# Patient Record
Sex: Male | Born: 1935
Health system: Southern US, Community
[De-identification: ages and names within clinical notes are randomized; demographics above are authoritative.]

## PROBLEM LIST (undated history)

## (undated) DIAGNOSIS — I10 Essential (primary) hypertension: Secondary | ICD-10-CM

## (undated) DIAGNOSIS — C801 Malignant (primary) neoplasm, unspecified: Secondary | ICD-10-CM

## (undated) DIAGNOSIS — C20 Malignant neoplasm of rectum: Secondary | ICD-10-CM

## (undated) DIAGNOSIS — E785 Hyperlipidemia, unspecified: Secondary | ICD-10-CM

## (undated) DIAGNOSIS — I2510 Atherosclerotic heart disease of native coronary artery without angina pectoris: Secondary | ICD-10-CM

## (undated) HISTORY — PX: RECTAL SURGERY: SHX760

## (undated) HISTORY — DX: Malignant neoplasm of rectum: C20

## (undated) HISTORY — DX: Atherosclerotic heart disease of native coronary artery without angina pectoris: I25.10

## (undated) HISTORY — PX: CORONARY ARTERY BYPASS GRAFT: SHX141

## (undated) HISTORY — DX: Essential (primary) hypertension: I10

## (undated) HISTORY — PX: COLOSTOMY: SHX63

## (undated) HISTORY — PX: COLON SURGERY: SHX602

## (undated) HISTORY — DX: Hyperlipidemia, unspecified: E78.5

---

## 1997-04-21 HISTORY — PX: BYPASS GRAFT: SHX909

## 1997-08-30 ENCOUNTER — Ambulatory Visit (HOSPITAL_COMMUNITY)
Admission: RE | Admit: 1997-08-30 | Discharge: 1997-08-30 | Payer: Self-pay | Admitting: Thoracic Surgery (Cardiothoracic Vascular Surgery)

## 1997-08-31 ENCOUNTER — Inpatient Hospital Stay (HOSPITAL_COMMUNITY): Admission: RE | Admit: 1997-08-31 | Discharge: 1997-09-04 | Payer: Self-pay | Admitting: Cardiology

## 2000-11-09 ENCOUNTER — Ambulatory Visit (HOSPITAL_COMMUNITY): Admission: RE | Admit: 2000-11-09 | Discharge: 2000-11-09 | Payer: Self-pay | Admitting: Internal Medicine

## 2000-11-16 ENCOUNTER — Encounter (INDEPENDENT_AMBULATORY_CARE_PROVIDER_SITE_OTHER): Payer: Self-pay | Admitting: *Deleted

## 2000-11-16 LAB — CONVERTED CEMR LAB
Bilirubin, Direct: 0.1 mg/dL
Chloride: 24 meq/L
Cholesterol: 164 mg/dL
LDL Cholesterol: 97 mg/dL
Potassium: 102 meq/L
Sodium: 4.3 meq/L
Total Protein: 7.5 g/dL

## 2000-12-23 ENCOUNTER — Encounter: Payer: Self-pay | Admitting: Cardiology

## 2000-12-23 ENCOUNTER — Ambulatory Visit (HOSPITAL_COMMUNITY): Admission: RE | Admit: 2000-12-23 | Discharge: 2000-12-23 | Payer: Self-pay | Admitting: Cardiology

## 2001-05-25 ENCOUNTER — Inpatient Hospital Stay (HOSPITAL_COMMUNITY): Admission: AD | Admit: 2001-05-25 | Discharge: 2001-05-26 | Payer: Self-pay | Admitting: General Surgery

## 2001-05-25 ENCOUNTER — Encounter: Payer: Self-pay | Admitting: General Surgery

## 2001-06-28 ENCOUNTER — Ambulatory Visit (HOSPITAL_COMMUNITY): Admission: RE | Admit: 2001-06-28 | Discharge: 2001-06-28 | Payer: Self-pay | Admitting: Pulmonary Disease

## 2001-07-05 ENCOUNTER — Ambulatory Visit (HOSPITAL_COMMUNITY): Admission: RE | Admit: 2001-07-05 | Discharge: 2001-07-05 | Payer: Self-pay | Admitting: Pulmonary Disease

## 2001-09-30 ENCOUNTER — Ambulatory Visit (HOSPITAL_COMMUNITY): Admission: RE | Admit: 2001-09-30 | Discharge: 2001-09-30 | Payer: Self-pay | Admitting: Pulmonary Disease

## 2002-01-11 ENCOUNTER — Ambulatory Visit (HOSPITAL_COMMUNITY): Admission: RE | Admit: 2002-01-11 | Discharge: 2002-01-11 | Payer: Self-pay | Admitting: Cardiology

## 2002-01-11 ENCOUNTER — Encounter: Payer: Self-pay | Admitting: Cardiology

## 2002-04-28 ENCOUNTER — Ambulatory Visit (HOSPITAL_COMMUNITY): Admission: RE | Admit: 2002-04-28 | Discharge: 2002-04-28 | Payer: Self-pay | Admitting: Pulmonary Disease

## 2003-01-20 ENCOUNTER — Ambulatory Visit (HOSPITAL_COMMUNITY): Admission: RE | Admit: 2003-01-20 | Discharge: 2003-01-20 | Payer: Self-pay | Admitting: Cardiology

## 2003-01-20 ENCOUNTER — Encounter: Payer: Self-pay | Admitting: Cardiology

## 2003-04-22 HISTORY — PX: CARDIOVASCULAR STRESS TEST: SHX262

## 2004-01-23 ENCOUNTER — Ambulatory Visit (HOSPITAL_COMMUNITY): Admission: RE | Admit: 2004-01-23 | Discharge: 2004-01-23 | Payer: Self-pay | Admitting: Internal Medicine

## 2004-06-04 ENCOUNTER — Inpatient Hospital Stay (HOSPITAL_COMMUNITY): Admission: RE | Admit: 2004-06-04 | Discharge: 2004-06-15 | Payer: Self-pay | Admitting: General Surgery

## 2004-12-19 ENCOUNTER — Ambulatory Visit: Payer: Self-pay | Admitting: *Deleted

## 2005-08-29 ENCOUNTER — Ambulatory Visit (HOSPITAL_COMMUNITY): Admission: RE | Admit: 2005-08-29 | Discharge: 2005-08-29 | Payer: Self-pay | Admitting: Pulmonary Disease

## 2006-07-27 ENCOUNTER — Ambulatory Visit (HOSPITAL_COMMUNITY): Admission: RE | Admit: 2006-07-27 | Discharge: 2006-07-27 | Payer: Self-pay | Admitting: Ophthalmology

## 2008-01-20 IMAGING — CT CT PELVIS W/ CM
2 of 6 series · 11 of 32 positions shown, 16 images · IV contrast (omnipaque)
Comparison: none

CLINICAL DATA: Confusion, colon cancer.
HEAD CT WITHOUT AND WITH CONTRAST ? 08/29/05:
TECHNIQUE: Contiguous axial CT images were obtained from the base of the skull through the vertex according to standard protocol before and after administration of intravenous contrast.
Contrast:  150 cc Omnipaque 300 IV.
TECHNIQUE: Multidetector CT imaging of the chest was performed following the standard protocol during bolus administration of intravenous contrast.
TECHNIQUE: Multidetector CT imaging of the abdomen was performed following the standard protocol during bolus administration of intravenous contrast.
TECHNIQUE: Multidetector CT imaging of the pelvis was performed following the standard protocol during bolus administration of intravenous contrast.

[Series 1598: — · axial · 0.70mm/px · z∈[-1040,-860]mm · 4 of 62 slices shown (1 of 2)]
[im 13/62  soft-tissue]
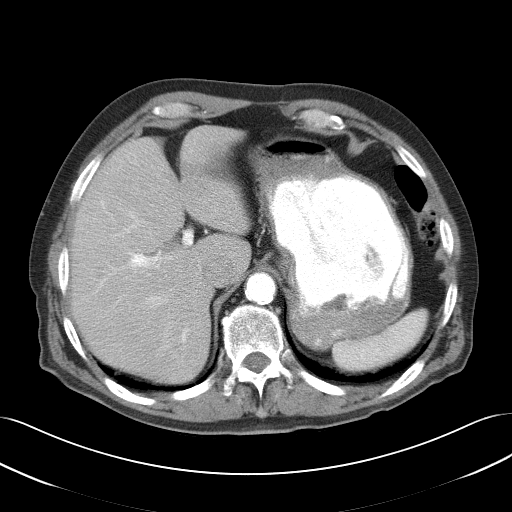
[im 25/62  soft-tissue]
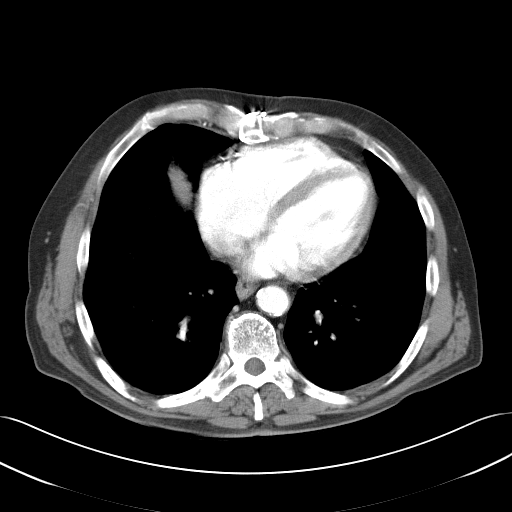
[im 37/62  soft-tissue]
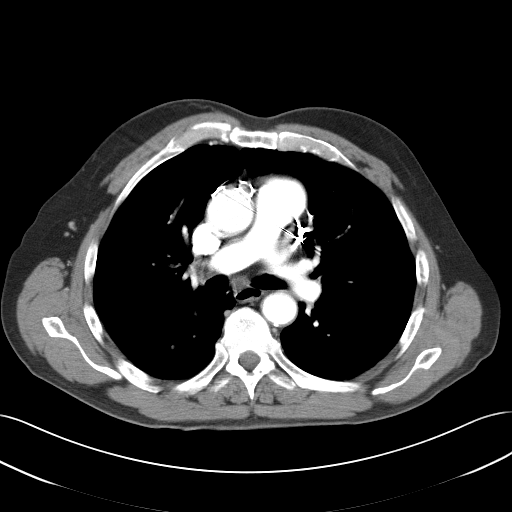
[im 49/62  soft-tissue]
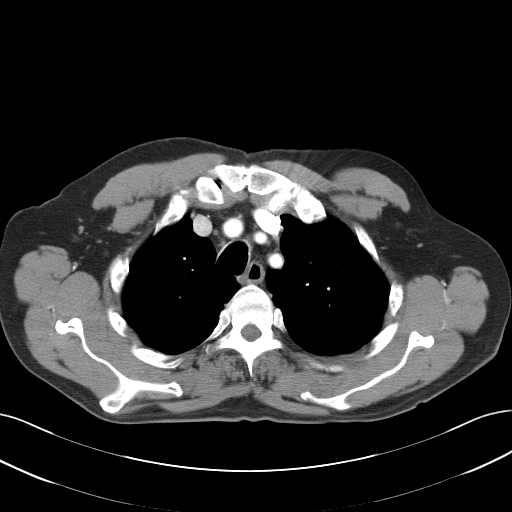

[Series 1599: — · axial · 0.71mm/px · z∈[-1387,-1047]mm · 7 of 92 slices shown, 12 images (2 of 2)]
[im 12/92  soft-tissue]
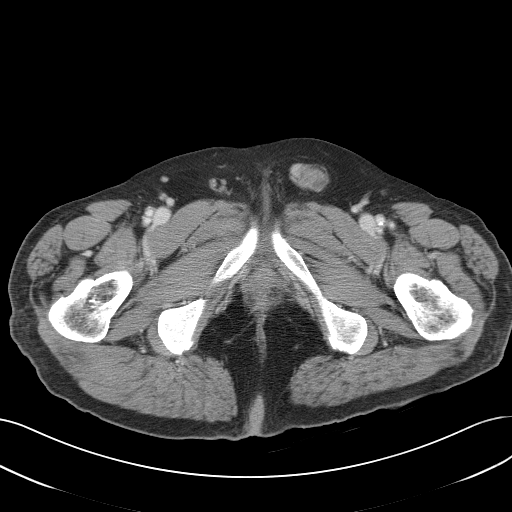
[im 12/92  bone]
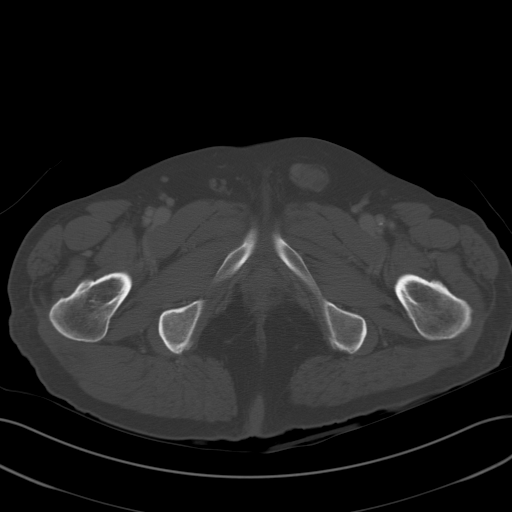
[im 23/92  soft-tissue]
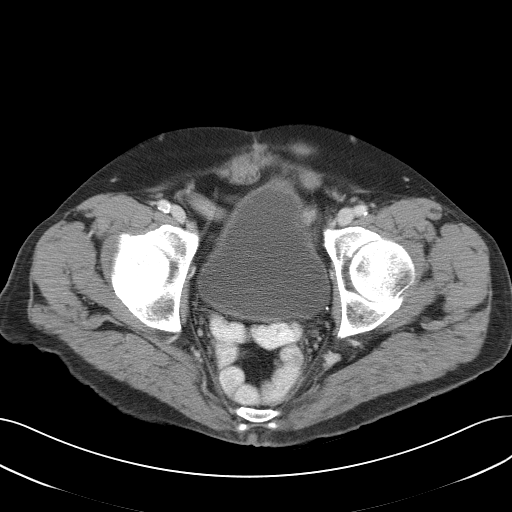
[im 35/92  soft-tissue]
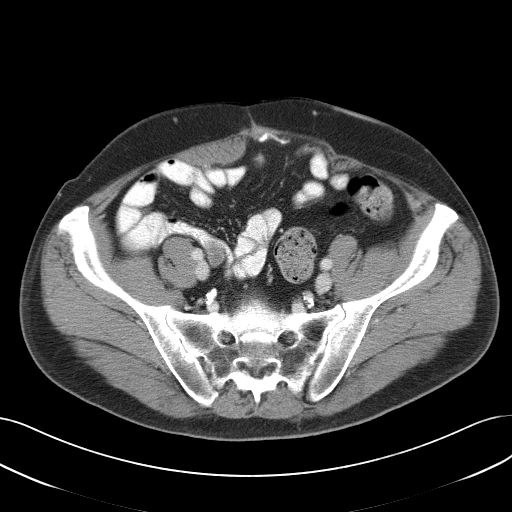
[im 46/92  soft-tissue]
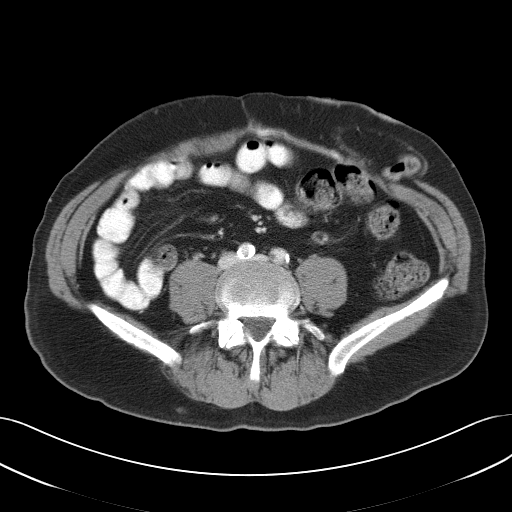
[im 46/92  lung]
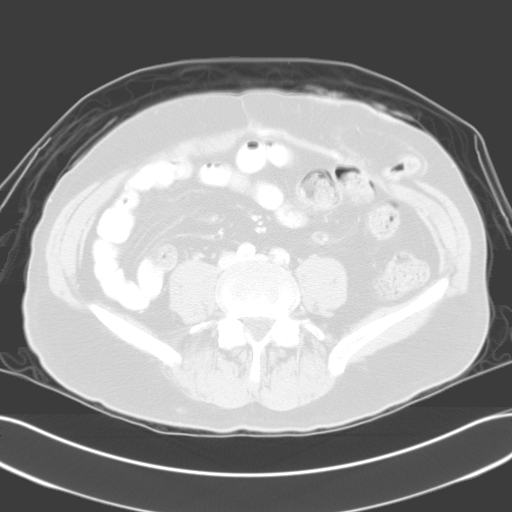
[im 57/92  soft-tissue]
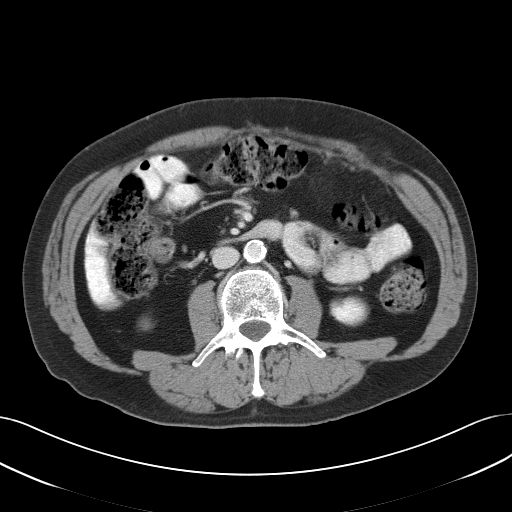
[im 57/92  lung]
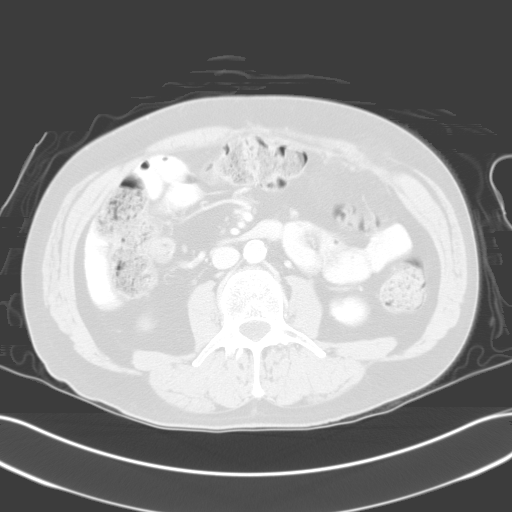
[im 69/92  soft-tissue]
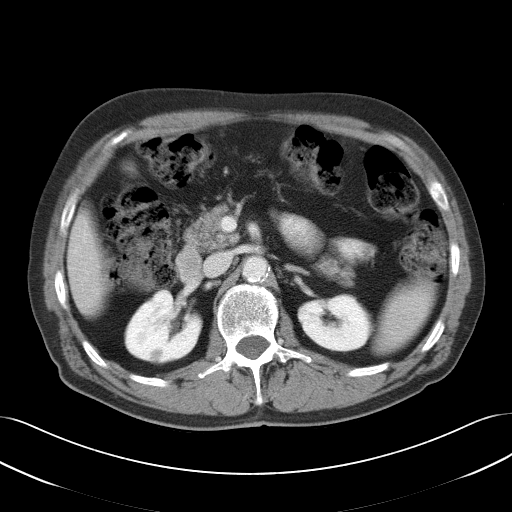
[im 69/92  lung]
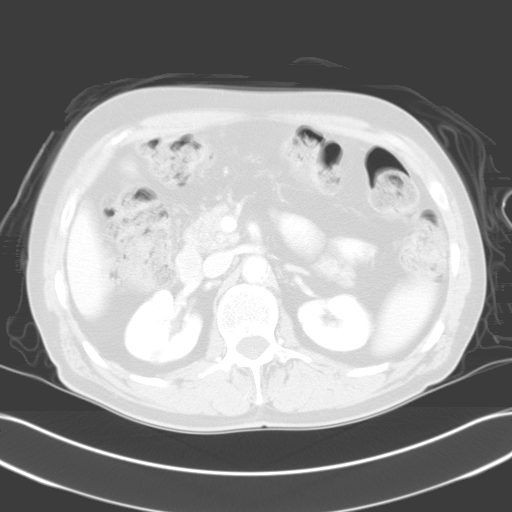
[im 80/92  soft-tissue]
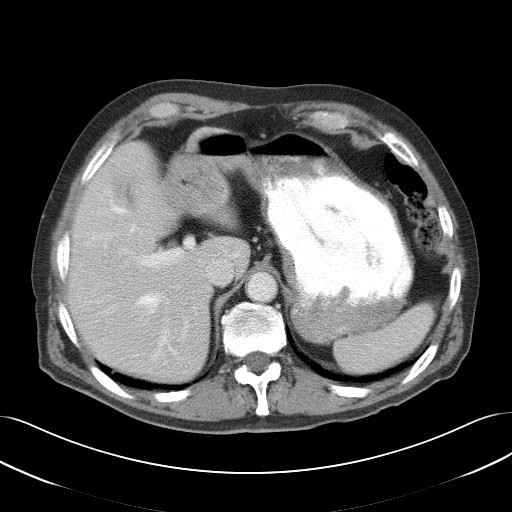
[im 80/92  lung]
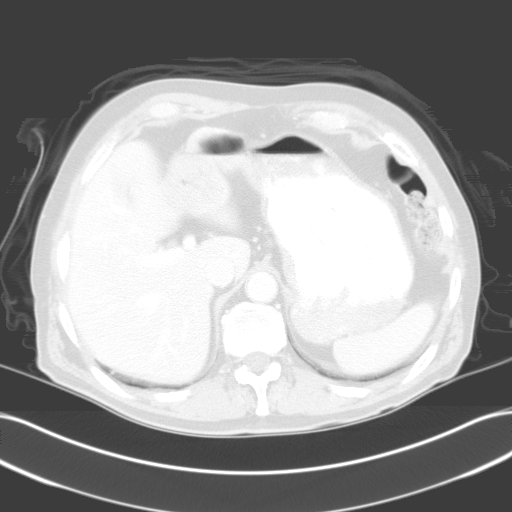

[11 of 32 positions shown; findings below may reference images not displayed]

FINDINGS: No acute intracranial hemorrhage is seen.  There is no intra- or extra-axial fluid collection.  There is a 5 mm hypodensity in the left region of the external capsule which may represent a Virchow-Robin space versus age-indeterminate lacunar infarct.  No midline shift or mass effect is seen.  The ventricles are normal in size.  No enhancing mass lesion is seen on postcontrast imaging.  No lytic or sclerotic skull lesion is present.  The paranasal sinuses and orbits are intact.
IMPRESSION: No acute intracranial finding or evidence for enhancing metastatic lesions.
5 mm left external capsular age-indeterminate lacunar infarct versus Virchow-Robin space.
CHEST CT WITH CONTRAST ? 08/29/05:
FINDINGS: Evidence of CABG is noted.  The heart size is borderline enlarged.  There is no mediastinal or perihilar lymphadenopathy.  No pleural or pericardial effusion.  Motion artifact obscures the lung parenchyma fine detail.  Bibasilar dependent atelectasis is noted.  No pulmonary mass is seen.  A 3 mm oval nodule abutting the left major fissure on image #28 is consistent with an intrapulmonary lymph node.
IMPRESSION: No acute intrathoracic process or evidence for disease metastasis.  
ABDOMEN CT WITH CONTRAST ? 08/29/05:
FINDINGS: 10 mm and 4 mm right lower renal pole cysts are noted.  The liver, gallbladder, spleen, pancreas, and adrenal glands are unremarkable.  No hydronephrosis is seen.  A few small mesenteric lymph nodes are identified, the largest of which measures 4 mm in short-axis dimensions.  There is no ascites or free air.
IMPRESSION: No acute intra-abdominal pathology or CT evidence for metastatic disease.
Right lower renal pole cysts.
PELVIS CT WITH CONTRAST ? 08/29/05:
FINDINGS: There is a left-sided fat-containing parastomal hernia and diastasis recti containing a knuckle of small bowel over the lower pelvis.  No small or large bowel wall thickening or dilatation is seen.  The patient is status post distal subtotal colectomy.  There is no pelvic lymphadenopathy or free fluid.  A fluid-containing left inguinal hernia is noted.  No lytic or sclerotic osseous lesion is seen.  Degenerative change is noted in the spine.
IMPRESSION: Left lower quadrant colostomy from subtotal distal colectomy with fat-containing parastomal hernia present.
Diastasis recti containing a knuckle of small bowel; no secondary evidence for small bowel obstruction.
Fluid-containing left inguinal hernia.
No CT evidence for intrapelvic metastases.

## 2008-09-25 ENCOUNTER — Ambulatory Visit (HOSPITAL_COMMUNITY): Admission: RE | Admit: 2008-09-25 | Discharge: 2008-09-25 | Payer: Self-pay | Admitting: Ophthalmology

## 2009-04-24 DIAGNOSIS — E785 Hyperlipidemia, unspecified: Secondary | ICD-10-CM

## 2009-04-24 DIAGNOSIS — I10 Essential (primary) hypertension: Secondary | ICD-10-CM | POA: Insufficient documentation

## 2009-04-25 ENCOUNTER — Ambulatory Visit: Payer: Self-pay | Admitting: Cardiology

## 2009-04-25 ENCOUNTER — Encounter (INDEPENDENT_AMBULATORY_CARE_PROVIDER_SITE_OTHER): Payer: Self-pay | Admitting: *Deleted

## 2009-04-25 DIAGNOSIS — I25810 Atherosclerosis of coronary artery bypass graft(s) without angina pectoris: Secondary | ICD-10-CM | POA: Insufficient documentation

## 2010-05-21 NOTE — Miscellaneous (Signed)
Summary: labs cmp,lipids,liver tsh 11/16/2000  Clinical Lists Changes  Observations: Added new observation of ALBUMIN: 4.4 g/dL (60/45/4098 1:19) Added new observation of PROTEIN, TOT: 7.5 g/dL (14/78/2956 2:13) Added new observation of SGPT (ALT): 19 units/L (11/16/2000 9:42) Added new observation of SGOT (AST): 19 units/L (11/16/2000 9:42) Added new observation of ALK PHOS: 60 units/L (11/16/2000 9:42) Added new observation of BILI DIRECT: 0.1 mg/dL (08/65/7846 9:62) Added new observation of CL SERUM: 24 meq/L (11/16/2000 9:42) Added new observation of K SERUM: 102 meq/L (11/16/2000 9:42) Added new observation of NA: 4.3 meq/L (11/16/2000 9:42) Added new observation of LDL: 97 mg/dL (95/28/4132 4:40) Added new observation of HDL: 45 mg/dL (02/15/2535 6:44) Added new observation of TRIGLYC TOT: 112 mg/dL (03/47/4259 5:63) Added new observation of CHOLESTEROL: 164 mg/dL (87/56/4332 9:51) Added new observation of TSH: 1.599 microintl units/mL (11/16/2000 9:42)

## 2010-05-21 NOTE — Assessment & Plan Note (Signed)
Summary: EC6 JUST TIME FOR A CHECK UP/TMJ   Visit Type:  Follow-up Primary Provider:  Dr.Hawkins   History of Present Illness: [name] comes in today after a three-year absence from the practice. He has a history of coronary bypass grafting 11 years ago which I cannot find an E. chart. Last objective assessment I can find was an echocardiogram in 2004 which showed normal left ventricular function. He also had a normal stress echo at that time.  He is totally asymptomatic. He mows grass, chills no last week of Christmas, and stays wide open and has no symptoms of angina or ischemia. He denies orthopnea PND or peripheral edema. He's had no palpitations, presyncope or syncope.  He is very compliant with his medications. Blood work is followed by Dr. Juanetta Gosling. His last lipids were goal except for low HDL.  Current Medications (verified): 1)  Norvasc 5 Mg Tabs (Amlodipine Besylate) .... Take 1 Tab Daily 2)  Micardis 40 Mg Tabs (Telmisartan) .... Take 1tab Daily 3)  Aspir-Low 81 Mg Tbec (Aspirin) .... Take 1 Tab Daily 4)  Folic Acid 1 Mg Tabs (Folic Acid) .... Take 1 Tab Daily 5)  Lipitor 10 Mg Tabs (Atorvastatin Calcium) .... Take 1 Tab Daily 6)  Daily Multi  Tabs (Multiple Vitamins-Minerals) .... Take 1 Tab Daily 7)  Fish Oil 1000 Mg Caps (Omega-3 Fatty Acids) .... Take 1 Cap Daily  Allergies (verified): No Known Drug Allergies  Past History:  Past Medical History: Last updated: 04/24/2009 Current Problems:  HYPERLIPIDEMIA (ICD-272.4) ATHEROSCLEROTIC HEART DISEASE (ICD-414.00) HYPERTENSION (ICD-401.9)  Past Surgical History: Last updated: 04/24/2009 rectal carcinoma coronary artery bypass graft  with 5 vessel bypass in 1999 stress test 2005  Social History: Last updated: 04/24/2009 Retired  Married  Tobacco Use - Yes.  Alcohol Use - no Regular Exercise - yes Drug Use - no  Risk Factors: Exercise: yes (04/24/2009)  Risk Factors: Smoking Status: current  (04/24/2009)  Review of Systems       negative other than history of present illness  Vital Signs:  Patient profile:   76 year old male Height:      68 inches Weight:      145 pounds BMI:     22.13 Pulse rate:   85 / minute BP sitting:   148 / 72  (right arm)  Vitals Entered By: Dreama Saa, CNA (April 25, 2009 10:58 AM)  Physical Exam  General:  Well developed, well nourished, in no acute distress. Head:  normocephalic and atraumatic Eyes:  PERRLA/EOM intact; conjunctiva and lids normal. Neck:  Neck supple, no JVD. No masses, thyromegaly or abnormal cervical nodes. Lungs:  Clear bilaterally to auscultation and percussion. Heart:  Non-displaced PMI, chest non-tender; regular rate and rhythm, S1, S2 without murmurs, rubs or gallops. Carotid upstroke normal, no bruit. Normal abdominal aortic size, no bruits. Femorals normal pulses, no bruits. Pedals normal pulses. No edema, no varicosities. Abdomen:  Bowel sounds positive; abdomen soft and non-tender without masses, organomegaly, or hernias noted. No hepatosplenomegaly. Msk:  Back normal, normal gait. Muscle strength and tone normal. Pulses:  pulses normal in all 4 extremities Extremities:  No clubbing or cyanosis. Neurologic:  Alert and oriented x 3. Skin:  Intact without lesions or rashes. Psych:  Normal affect.   Problems:  Medical Problems Added: 1)  Dx of Cad, Artery Bypass Graft  (ICD-414.04)  EKG  Procedure date:  04/25/2009  Findings:      normal sinus rhythm, normal EKG  Impression &  Recommendations:  Problem # 1:  CAD, ARTERY BYPASS GRAFT (ICD-414.04) Assessment Unchanged He is extremely active and totally asymptomatic. The utility of the stress test is marginal. I've asked him to continue his current medications and see [place] back every year. His updated medication list for this problem includes:    Norvasc 5 Mg Tabs (Amlodipine besylate) .Marland Kitchen... Take 1 tab daily    Aspir-low 81 Mg Tbec (Aspirin) .Marland Kitchen...  Take 1 tab daily  Problem # 2:  HYPERLIPIDEMIA (ICD-272.4) Assessment: Unchanged  His updated medication list for this problem includes:    Lipitor 10 Mg Tabs (Atorvastatin calcium) .Marland Kitchen... Take 1 tab daily  Problem # 3:  HYPERTENSION (ICD-401.9) Assessment: Improved  His updated medication list for this problem includes:    Norvasc 5 Mg Tabs (Amlodipine besylate) .Marland Kitchen... Take 1 tab daily    Micardis 40 Mg Tabs (Telmisartan) .Marland Kitchen... Take 1tab daily    Aspir-low 81 Mg Tbec (Aspirin) .Marland Kitchen... Take 1 tab daily  Patient Instructions: 1)  Your physician recommends that you schedule a follow-up appointment in: 1 year

## 2010-06-05 ENCOUNTER — Encounter: Payer: Self-pay | Admitting: Cardiology

## 2010-07-29 LAB — BASIC METABOLIC PANEL
Chloride: 104 mEq/L (ref 96–112)
GFR calc non Af Amer: >60 mL/min (ref >60)
Glucose, Bld: 97 mg/dL (ref 70–99)
Potassium: 4.4 mEq/L (ref 3.5–5.1)
Sodium: 140 mEq/L (ref 135–145)

## 2010-09-06 NOTE — H&P (Signed)
[name], [name]             ACCOUNT NO.:  [id number]   MEDICAL RECORD NO.:  [id number]          PATIENT TYPE:  AMB   LOCATION:  DAY                           FACILITY:  [facility]   PHYSICIAN:  Jerolyn Shin C. Katrinka Blazing, M.D.   DATE OF BIRTH:  07/18/1934   DATE OF ADMISSION:  DATE OF DISCHARGE:  LH                                HISTORY & PHYSICAL   HISTORY OF PRESENT ILLNESS:  A 76 year old male with a history of rectal  carcinoma, status post abdominoperineal resection with colostomy in 2001.  He developed an incisional hernia that was repaired in 2003.  He states that  the hernia recurred shortly after the repair.  He has a large incisional  hernia at this time.  He has also developed a large parastomal hernia.  The  patient is scheduled to have a primary incisional hernia repair, and repair  of the parastomal hernia using AlloDerm.   PAST HISTORY:  1.  History of rectal carcinoma.  2.  Hypertension.  3.  Hyperlipidemia.  4.  Atherosclerotic heart disease.  5.  The patient is status post coronary artery bypass graft with 5 vessel      bypass in 1999.  He had a stress test in 2005 that was negative for      ischemia.   He is active, and should be low risk for general anesthesia.   MEDICATIONS:  1.  Norvasc 5 mg daily.  2.  Micardis 40 mg daily.  3.  Aspirin 81 mg daily.  4.  Folic acid 40 mcg daily.  5.  Lipitor 10 mg daily.  6.  Multivitamin one daily.   ALLERGIES:  No known drug allergies.   FAMILY HISTORY:  Positive for diabetes and hypertension.   SOCIAL HISTORY:  The patient is married.  He is retired.  He does not drink  or smoke tobacco.  He does chew tobacco.  No history of drug use.   PHYSICAL EXAMINATION:  VITAL SIGNS:  Blood pressure 120/90, pulse 72,  respirations 20, weight 150 pounds.  HEENT:  Unremarkable.  NECK:  Supple.  No JVD, bruit, adenopathy, or thyromegaly.  CHEST:  Clear to auscultation.  No rales, rubs, rhonchi, or wheezes.  There  is a healed  mediansternotomy scar.  HEART:  Regular rate and rhythm without murmur, gallop, or rub.  ABDOMEN:  Large midline incisional hernia with a large parastomal hernia in  the left lower quadrant.  EXTREMITIES:  Unremarkable, except for a chronic scar in the right medial  leg from vein harvest for his coronary artery bypass graft.  NEUROLOGIC:  No focal motor, sensory, or cerebellar deficit.   IMPRESSION:  1.  Recurrent incisional hernia.  2.  Left lower quadrant parastomal hernia.  3.  Atherosclerotic heart disease, status post coronary artery bypass graft.  4.  Rectal carcinoma, status post abdominoperineal resection with colostomy.  5.  Hypertension.   PLAN:  The patient will have repair of an incisional hernia and parastomal  hernia using AlloDerm.      LCS/MEDQ  D:  06/03/2004  T:  06/04/2004  Job:  [id number]

## 2010-09-06 NOTE — Op Note (Signed)
[facility]  Patient:    [name], [name] Visit Number: [id number] MRN: [id number]          Service Type: OBV Location: 3A A301 01 Attending Physician:  Dalia Heading Dictated by:   Franky Macho, M.D. Proc. Date: 05/24/01 Admit Date:  05/24/2001   CC:         Kari Baars, M.D.   Operative Report  PATIENT AGE:  76 years old.  PREOPERATIVE DIAGNOSIS:   Incisional hernia, possible peristomal hernia.  POSTOPERATIVE DIAGNOSIS:  Incisional hernia, possible peristomal hernia.  OPERATION:  Incisional herniorrhaphy with mesh, peristomal herniorrhaphy.  SURGEON:  Franky Macho, M.D.  ANESTHESIA:  General endotracheal anesthesia.  INDICATIONS:  The patient is a 76 year old white male status post an abdominal peritoneal resection in August 2001 who now presents with both an incisional hernia and possible peristomal hernia.  The  risks and benefits of the procedures including bleeding, infection, the possibility of recurrence of the hernias were fully explained to the patient who gave informed consent.  DESCRIPTION OF PROCEDURE:  The patient was placed in the supine position. After induction of general endotracheal anesthesia, the abdomen was prepped and draped using the usual sterile technique with Betadine.  A sterile covering was used over the colostomy site which was in the left side of the abdomen.  An incision was made along the midline through the previous midline incision.  Excess scar tissue was excised.  The dissection was taken down to the peritoneal cavity.  The patient was noted to have attenuated fascia with a hernia sac present along the midline.  The hernia sac was then excised.  The peritoneal cavity was entered into without difficulty.  Minimal adhesive disease was noted within the belly cavity.  These adhesions were taken down over to the rectus muscle bilaterally.  The patient did indeed have a peristomal hernia, and the fascial  edges were reapproximated using a #1 Novofil suture.  This did not appear to stricture down the colostomy.  It allowed at least two fingers from within the abdominal cavity.  Next, dual mesh Gore-Tex was sized and secured to the surrounding rectus muscle and fascial layers.  This was secured using #1 Novofil interrupted sutures.  The subcutaneous layer was reapproximated using a 2-0 Monocryl interrupted sutures.  The skin was closed using Staples.  Betadine ointment and dry sterile dressing were applied.  All tape and needle counts were correct at the end of the procedure.  The patient was extubated in the operating room and went back to the recovery room awake and in stable condition.  COMPLICATIONS:  None.  SPECIMEN:   None.  ESTIMATED BLOOD LOSS:  Minimal. Dictated by:   Franky Macho, M.D. Attending Physician:  Dalia Heading DD:  05/24/01 TD:  05/24/01 Job: [id number]

## 2010-09-06 NOTE — Discharge Summary (Signed)
[facility]  Patient:    [name], [name] Visit Number: [id number] MRN: [id number]          Service Type: MED Location: 3A A301 01 Attending Physician:  Dalia Heading Dictated by:   Franky Macho, M.D. Admit Date:  05/24/2001 Discharge Date: 05/26/2001   CC:         Kari Baars, M.D.   Discharge Summary  HOSPITAL COURSE:  Patient is a 77 year old white male who underwent an incisional herniorrhaphy with mesh on May 24, 2001.  Tolerated the procedure well.  His postoperative course was remarkable for moderate incisional pain and fever.  Chest x-ray performed postoperatively revealed a questionable patchy infiltrate in the left lower lobe.  He was continued on antibiotics.  On postoperative day #2 he was feeling better and his pain was under better control.  The patient is being discharged home in in good and improving condition on May 26, 2001.  DISCHARGE MEDICATIONS: 1. Vicodin one to two tablets p.o. q.4h. p.r.n. pain. 2. Zithromax dose pack. 3. He is to resume his other medications as previously prescribed.  FOLLOWUP:  Patient will follow up with Dr. Franky Macho on June 03, 2001.  PRINCIPAL DIAGNOSES: 1. Incisional hernia. 2. History of rectal cancer. 3. Hypertension. 4. Probable left lower lobe infiltrate.  PRINCIPAL PROCEDURE:  Incisional herniorrhaphy on May 24, 2001. Dictated by:   Franky Macho, M.D. Attending Physician:  Dalia Heading DD:  05/26/01 TD:  05/27/01 Job: [id number]

## 2010-09-06 NOTE — Procedures (Signed)
   NAMESARATH, PRIVOTT                       ACCOUNT NO.:  [id number]   MEDICAL RECORD NO.:  [id number]                   PATIENT TYPE:  OUT   LOCATION:  RAD                                  FACILITY:  [facility]   PHYSICIAN:  [place] Bing, M.D.               DATE OF BIRTH:  12-04-1934   DATE OF PROCEDURE:  01/20/2003  DATE OF DISCHARGE:  01/20/2003                                  ECHOCARDIOGRAM   CLINICAL DATA:  A 76 year old gentleman with coronary artery disease,  hypertension, coronary artery bypass grafting surgery in 1998.   M-MODE:  Aorta 3.0. Left atrium 3.5. Septum 1.3. Posterior wall 1.2. Left  ventricular diastole 4.4. Left ventricular systole 2.9.   FINDINGS:  1. Technically adequate echocardiographic study.  2. Left atrial size at the upper limit of normal to slightly increased;     right atrial size upper normal. Normal right ventricular size and     function.  3. Normal aortic, mitral, tricuspid, and pulmonic valves.  4. Normal Doppler study with trivial mitral regurgitation. Physiologic     tricuspid regurgitation.  5. Normal internal dimension of the left ventricle; borderline left     ventricular hypertrophy. Normal regional and global left ventricular     systolic function.  6. Normal inferior vena cava.      ___________________________________________                                            [place] Bing, M.D.   RR/MEDQ  D:  01/22/2003  T:  01/22/2003  Job:  [id number]

## 2010-09-06 NOTE — Op Note (Signed)
[name], [name]             ACCOUNT NO.:  [id number]   MEDICAL RECORD NO.:  [id number]          PATIENT TYPE:  AMB   LOCATION:  DAY                           FACILITY:  [facility]   PHYSICIAN:  Lionel December, M.D.    DATE OF BIRTH:  02/19/1935   DATE OF PROCEDURE:  01/23/2004  DATE OF DISCHARGE:                                 OPERATIVE REPORT   PROCEDURE:  Total colonoscopy.   INDICATIONS:  [name] is a 76 year old Caucasian male who is status post APR  for rectal carcinoma in August 2001.  His last colonoscopy was in July 2002.  He is returning for surveillance colonoscopy.  Since this last surgery he  had ventral herniorrhaphy but his hernia has relapsed.   The procedure risks were reviewed with the patient, and informed consent was  obtained.   PREMEDICATION:  Demerol 25 mg IV, Versed 4 mg IV.   FINDINGS:  Procedure performed in endoscopy suite.  The patient's vital  signs and O2 saturations were monitored during the procedure and remained  stable.  The patient was placed in supine position.  A digital exam of the  colostomy was normal.  The Olympus video scope was advanced via colostomy  into sigmoid, proximal sigmoid and/or descending colon.  This part of the  colon was quite tortuous, but slowly and carefully the scope was advanced  into hepatic flexure and beyond into cecum.  Cecal landmarks, i.e.,  appendiceal orifice/stump and ileocecal valve, were well-seen and picture  taken for the record.  Preparation was felt to be excellent except he had  some stool in his right colon.  As the scope was withdrawn, colonic mucosa  was once again carefully examined and there were no polyps and/or tumor  masses.  Endoscope was withdrawn.  The patient tolerated the procedure well.   FINAL DIAGNOSES:  1.  Redundant but normal colonoscopy.  2.  Normal appearance to colostomy stoma.  3.  He has a large ventral hernia which is in the left lower quadrant, which      is completely  reducible.   RECOMMENDATIONS:  1.  He will resume his usual medications.  2.  CEA and LFTs will be checked today.  3.  He will return for next exam in three years from now.     Naje   NR/MEDQ  D:  01/23/2004  T:  01/23/2004  Job:  [id number]   cc:   Ramon Dredge L. Juanetta Gosling, M.D.  [address]  [place]  [place] [address]  Fax: [phone]   Dalia Heading, M.D.  [address], [place]  [place] [address]  Fax: [phone]

## 2010-09-06 NOTE — Op Note (Signed)
[name], [name]             ACCOUNT NO.:  [id number]   MEDICAL RECORD NO.:  [id number]          PATIENT TYPE:  INP   LOCATION:  A315                          FACILITY:  [facility]   PHYSICIAN:  Jerolyn Shin C. Katrinka Blazing, M.D.   DATE OF BIRTH:  01-Aug-1934   DATE OF PROCEDURE:  06/04/2004  DATE OF DISCHARGE:  06/15/2004                                 OPERATIVE REPORT   PREOPERATIVE DIAGNOSIS:  Recurrent incisional hernia and parastomal hernia.   POSTOPERATIVE DIAGNOSIS:  Recurrent incisional hernia and parastomal hernia.   PROCEDURE:  Repair of recurrent incisional hernia and repair of large  parastomal hernia.   SURGEON:  Dr. Katrinka Blazing.   DESCRIPTION:  Under general anesthesia, the patient's abdomen was prepped  and draped in a sterile field.  Before prepping the colostomy was sutured  closed with running 2-0 silk.  The abdomen was then prepped and draped and  covered with an Ioban adhesive dressing.  The old midline incision was  opened.  There were extensive adhesions to the anterior abdominal wall.  These adhesions were taken down.  There was a large hernia that started at  the pubis and extended up to the apex of the incision in the supraumbilical  midline.  This old incision was totally opened.  There were no retained  sutures noted.  There was a large parastomal hernia which extended from the  lateral rectus lateral to the stoma close to the midline.  There was  herniation of small bowel with the midline incision as well as a large  volume of bowel that was in the parastomal hernia.  Using sharp dissection  the colon and small bowel were dissected from the hernia sac.  The hernia  sac at the parastomal hernia was removed.  Irrigation was carried out.  The  bowel was then loosely reattached to the lateral pelvic gutter with 3-0  silk.  A 5 by 10 cm medium thickness Alloderm graft matrix was chosen.  The  lateral portion of the graft was sutured to the attenuated transversalis as  well as  the rectus muscle remnant.  This was initially done using running 0  Prolene.  At its junction with the stoma, the bowel was sutured to the graft  along its superior margin with interrupted 3-0 Prolene.  The medial edge of  the graft was sutured to the residual fascia from the midline.  There was  only about a 2 cm rim of fascia that was left.  This was sutured through and  through with 0 Prolene.  The inferior margin was sutured to attenuate the  fascia in the lower abdominal wall immediately above the inguinal ligament.  This was done with 0 Prolene.  Once this was done, it was inspected and it  was felt that this would effect a very good repair.  The incision was then  closed using running #1 Prolene on the fascia.  There was no herniation of  the graft through the old parastomal herniation site.  A sterile dressing  was placed over the midline incision.  The sutures were removed from the  stoma  and the stoma appliance was placed.  The patient tolerated the  procedure well.  He was awakened from anesthesia, transferred to a bed and  taken to the postanesthetic care unit in satisfactory condition.    LCS/MEDQ  D:  07/28/2004  T:  07/28/2004  Job:  [id number]

## 2010-09-06 NOTE — Procedures (Signed)
   [name], [name]                       ACCOUNT NO.:  [id number]   MEDICAL RECORD NO.:  [id number]                   PATIENT TYPE:  OUT   LOCATION:  RAD                                  FACILITY:  [facility]   PHYSICIAN:  Ryder Bing, M.D.               DATE OF BIRTH:  [birth year]   DATE OF PROCEDURE:  01/20/2003  DATE OF DISCHARGE:  01/20/2003                                  ECHOCARDIOGRAM   CLINICAL DATA:  A 76 year old gentleman with coronary artery bypass grafting  surgery in 1998.   FINDINGS:  1. Upright treadmill exercise performed to a work load of 10 minutes and     heart rate of 132, 86% of age, predicted maximum. Exercise discontinued     due to fatigue. No chest pain reported.  2. Blood pressure increased from a resting valve of 160/80 to 210/90 at peak     exercise, representing mild resting hypertension and a mildly     hypertensive response to exercise.  3. No significant arrhythmias identified.  4. Electrocardiogram: Normal sinus rhythm; very minor non-specific ST     segment abnormality.  5. Stress electrocardiogram: Insignificant upsloping ST segment depression.  6. Baseline echocardiogram: Normal chamber dimension; no significant     valvular abnormality. Normal left ventricular size and function with     borderline left ventricular hypertrophy.  7. Post-exercise echocardiogram: Increased contractility in all myocardial     segments.   IMPRESSION:  Negative stress echocardiogram revealing adequate exercise  tolerance, a mildly hypertensive blood pressure response, normal resting and  stress electrocardiogram's and a normal echocardiographic response to  exercise with no evidence for myocardial ischemia or infarction. Other  findings as noted.      ___________________________________________                                            [place] Bing, M.D.   RR/MEDQ  D:  01/22/2003  T:  01/22/2003  Job:  [id number]

## 2010-09-06 NOTE — Discharge Summary (Signed)
NAMEAKON, [name]             ACCOUNT NO.:  [id number]   MEDICAL RECORD NO.:  [id number]          PATIENT TYPE:  INP   LOCATION:  A315                          FACILITY:  [facility]   PHYSICIAN:  Jerolyn Shin C. Katrinka Blazing, M.D.   DATE OF BIRTH:  May 10, 1934   DATE OF ADMISSION:  06/04/2004  DATE OF DISCHARGE:  02/25/2006LH                                 DISCHARGE SUMMARY   DISCHARGE DIAGNOSIS:  1.  Parastomal hernia.  2.  Recurrent incisional hernia.  3.  Atherosclerotic heart disease.  4.  Hypertension.  5.  History of rectal carcinoma status post abdominal perineal resection.   SPECIAL PROCEDURES:  Repair of recurrent incisional hernia and repair of  parastomal hernia with AlloDerm matrix graft, February 14.   DISPOSITION:  The patient discharged home in stable satisfactory condition.   DISCHARGE MEDICATIONS:  1.  Tylox one every 4 hours as needed for pain.  2.  Norvasc 5 mg q.d.  3.  Micardis 40 mg q.d.  4.  Aspirin 81 mg q.d.  5.  Folic acid 40 mcg q.d.  6.  Multivitamins 1 q.d.  7.  Reglan 10 mg a.c. and h.s.  8.  Protonix 40 mg q.d.  9.  Lorazepam 1 mg q.d.   The patient is scheduled to be seen in the office 2 weeks post discharge.   SUMMARY:  The patient is a 76 year old male with a history of recurrent  rectal carcinoma status post abdominoperineal resection with colostomy 2001.  He had incisional hernia repair that was repaired in 2003. He states that  the hernia recurred shortly after the repair. He also has a very large  incisional hernia and had developed a large parastomal hernia. He was  scheduled to have primary incisional hernia repair and repair of parastomal  hernia using AlloDerm graft.   Significant history is that the patient is status post five-vessel coronary  artery bypass graft in 1999. He had a stress test in 2005 that was negative  for ischemia. He was cleared by cardiology and was felt to be low risk for  general anesthesia. The patient was admitted for  day surgery and underwent  repair of the large incisional hernia as well as the large parastomal hernia  with AlloDerm matrix graft. Because of the extensive adhesiolysis that had  to be done to gain access to the peritoneal cavity and to remove the bowel  from the large hernia sac, the patient had an anticipated postoperative  ileus. He had no other major difficulties. He did not have any cardiac  problems. He did not have any respiratory problems.   He had some return of intestinal function by the sixth postoperative day,  and his diet was slowly advanced over the next two days. His wound healed  uneventfully except that he developed a small seroma in the area of the  parastomal hernia repair. There was no evidence of infection.   By the February 24, he was stable. No complaints. He was tolerating a diet.  He had no complaint of chest pain or shortness of breath. His lungs were  clear. His  incisions were healed.  His staples were removed, and he was  discharged home in stable satisfactory condition.      LCS/MEDQ  D:  07/28/2004  T:  07/28/2004  Job:  [id number]

## 2011-04-16 ENCOUNTER — Encounter: Payer: Self-pay | Admitting: Cardiology

## 2011-05-01 DIAGNOSIS — I10 Essential (primary) hypertension: Secondary | ICD-10-CM | POA: Diagnosis not present

## 2011-05-01 DIAGNOSIS — E785 Hyperlipidemia, unspecified: Secondary | ICD-10-CM | POA: Diagnosis not present

## 2011-05-01 DIAGNOSIS — Z125 Encounter for screening for malignant neoplasm of prostate: Secondary | ICD-10-CM | POA: Diagnosis not present

## 2011-05-01 DIAGNOSIS — Z0000 Encounter for general adult medical examination without abnormal findings: Secondary | ICD-10-CM | POA: Diagnosis not present

## 2011-05-08 DIAGNOSIS — Z0000 Encounter for general adult medical examination without abnormal findings: Secondary | ICD-10-CM | POA: Diagnosis not present

## 2011-05-15 DIAGNOSIS — R7989 Other specified abnormal findings of blood chemistry: Secondary | ICD-10-CM | POA: Diagnosis not present

## 2011-08-14 DIAGNOSIS — H4010X0 Unspecified open-angle glaucoma, stage unspecified: Secondary | ICD-10-CM | POA: Diagnosis not present

## 2011-08-14 DIAGNOSIS — Z961 Presence of intraocular lens: Secondary | ICD-10-CM | POA: Diagnosis not present

## 2011-08-14 DIAGNOSIS — H2510 Age-related nuclear cataract, unspecified eye: Secondary | ICD-10-CM | POA: Diagnosis not present

## 2011-09-29 DIAGNOSIS — H4010X0 Unspecified open-angle glaucoma, stage unspecified: Secondary | ICD-10-CM | POA: Diagnosis not present

## 2012-01-07 DIAGNOSIS — Z23 Encounter for immunization: Secondary | ICD-10-CM | POA: Diagnosis not present

## 2012-03-17 DIAGNOSIS — H2510 Age-related nuclear cataract, unspecified eye: Secondary | ICD-10-CM | POA: Diagnosis not present

## 2012-03-17 DIAGNOSIS — H4010X0 Unspecified open-angle glaucoma, stage unspecified: Secondary | ICD-10-CM | POA: Diagnosis not present

## 2012-03-17 DIAGNOSIS — Z961 Presence of intraocular lens: Secondary | ICD-10-CM | POA: Diagnosis not present

## 2012-05-04 DIAGNOSIS — Z125 Encounter for screening for malignant neoplasm of prostate: Secondary | ICD-10-CM | POA: Diagnosis not present

## 2012-05-04 DIAGNOSIS — Z79899 Other long term (current) drug therapy: Secondary | ICD-10-CM | POA: Diagnosis not present

## 2012-05-04 DIAGNOSIS — E785 Hyperlipidemia, unspecified: Secondary | ICD-10-CM | POA: Diagnosis not present

## 2012-05-07 DIAGNOSIS — Z0000 Encounter for general adult medical examination without abnormal findings: Secondary | ICD-10-CM | POA: Diagnosis not present

## 2012-06-15 ENCOUNTER — Ambulatory Visit (INDEPENDENT_AMBULATORY_CARE_PROVIDER_SITE_OTHER): Payer: Medicare Other | Admitting: Urology

## 2012-06-15 DIAGNOSIS — N529 Male erectile dysfunction, unspecified: Secondary | ICD-10-CM

## 2012-09-14 DIAGNOSIS — H4010X0 Unspecified open-angle glaucoma, stage unspecified: Secondary | ICD-10-CM | POA: Diagnosis not present

## 2013-01-07 DIAGNOSIS — Z23 Encounter for immunization: Secondary | ICD-10-CM | POA: Diagnosis not present

## 2013-03-24 DIAGNOSIS — H4010X0 Unspecified open-angle glaucoma, stage unspecified: Secondary | ICD-10-CM | POA: Diagnosis not present

## 2013-03-24 DIAGNOSIS — H2510 Age-related nuclear cataract, unspecified eye: Secondary | ICD-10-CM | POA: Diagnosis not present

## 2013-05-16 DIAGNOSIS — Z125 Encounter for screening for malignant neoplasm of prostate: Secondary | ICD-10-CM | POA: Diagnosis not present

## 2013-05-16 DIAGNOSIS — I10 Essential (primary) hypertension: Secondary | ICD-10-CM | POA: Diagnosis not present

## 2013-05-16 DIAGNOSIS — E785 Hyperlipidemia, unspecified: Secondary | ICD-10-CM | POA: Diagnosis not present

## 2013-05-18 DIAGNOSIS — Z0000 Encounter for general adult medical examination without abnormal findings: Secondary | ICD-10-CM | POA: Diagnosis not present

## 2013-09-01 DIAGNOSIS — H538 Other visual disturbances: Secondary | ICD-10-CM | POA: Diagnosis not present

## 2013-09-01 DIAGNOSIS — H4010X0 Unspecified open-angle glaucoma, stage unspecified: Secondary | ICD-10-CM | POA: Diagnosis not present

## 2013-09-01 DIAGNOSIS — H2510 Age-related nuclear cataract, unspecified eye: Secondary | ICD-10-CM | POA: Diagnosis not present

## 2013-09-19 DIAGNOSIS — H4010X0 Unspecified open-angle glaucoma, stage unspecified: Secondary | ICD-10-CM | POA: Diagnosis not present

## 2013-10-05 DIAGNOSIS — Z85828 Personal history of other malignant neoplasm of skin: Secondary | ICD-10-CM | POA: Diagnosis not present

## 2013-10-05 DIAGNOSIS — L570 Actinic keratosis: Secondary | ICD-10-CM | POA: Diagnosis not present

## 2013-10-05 DIAGNOSIS — C44319 Basal cell carcinoma of skin of other parts of face: Secondary | ICD-10-CM | POA: Diagnosis not present

## 2013-10-05 DIAGNOSIS — C44211 Basal cell carcinoma of skin of unspecified ear and external auricular canal: Secondary | ICD-10-CM | POA: Diagnosis not present

## 2013-10-05 DIAGNOSIS — D485 Neoplasm of uncertain behavior of skin: Secondary | ICD-10-CM | POA: Diagnosis not present

## 2013-10-17 DIAGNOSIS — H4010X0 Unspecified open-angle glaucoma, stage unspecified: Secondary | ICD-10-CM | POA: Diagnosis not present

## 2013-10-20 DIAGNOSIS — C44319 Basal cell carcinoma of skin of other parts of face: Secondary | ICD-10-CM | POA: Diagnosis not present

## 2013-10-20 DIAGNOSIS — C44211 Basal cell carcinoma of skin of unspecified ear and external auricular canal: Secondary | ICD-10-CM | POA: Diagnosis not present

## 2014-01-11 DIAGNOSIS — Z23 Encounter for immunization: Secondary | ICD-10-CM | POA: Diagnosis not present

## 2014-02-20 DIAGNOSIS — Z85828 Personal history of other malignant neoplasm of skin: Secondary | ICD-10-CM | POA: Diagnosis not present

## 2014-02-20 DIAGNOSIS — L570 Actinic keratosis: Secondary | ICD-10-CM | POA: Diagnosis not present

## 2014-03-07 DIAGNOSIS — H2512 Age-related nuclear cataract, left eye: Secondary | ICD-10-CM | POA: Diagnosis not present

## 2014-03-07 DIAGNOSIS — H4011X2 Primary open-angle glaucoma, moderate stage: Secondary | ICD-10-CM | POA: Diagnosis not present

## 2014-03-07 DIAGNOSIS — H538 Other visual disturbances: Secondary | ICD-10-CM | POA: Diagnosis not present

## 2014-06-01 DIAGNOSIS — E785 Hyperlipidemia, unspecified: Secondary | ICD-10-CM | POA: Diagnosis not present

## 2014-06-01 DIAGNOSIS — I10 Essential (primary) hypertension: Secondary | ICD-10-CM | POA: Diagnosis not present

## 2014-06-01 DIAGNOSIS — C189 Malignant neoplasm of colon, unspecified: Secondary | ICD-10-CM | POA: Diagnosis not present

## 2014-06-01 DIAGNOSIS — I2510 Atherosclerotic heart disease of native coronary artery without angina pectoris: Secondary | ICD-10-CM | POA: Diagnosis not present

## 2014-06-01 DIAGNOSIS — F17200 Nicotine dependence, unspecified, uncomplicated: Secondary | ICD-10-CM | POA: Diagnosis not present

## 2014-06-14 DIAGNOSIS — Z0000 Encounter for general adult medical examination without abnormal findings: Secondary | ICD-10-CM | POA: Diagnosis not present

## 2014-09-05 DIAGNOSIS — L570 Actinic keratosis: Secondary | ICD-10-CM | POA: Diagnosis not present

## 2014-09-05 DIAGNOSIS — Z85828 Personal history of other malignant neoplasm of skin: Secondary | ICD-10-CM | POA: Diagnosis not present

## 2014-09-13 DIAGNOSIS — H4011X1 Primary open-angle glaucoma, mild stage: Secondary | ICD-10-CM | POA: Diagnosis not present

## 2014-09-13 DIAGNOSIS — H2512 Age-related nuclear cataract, left eye: Secondary | ICD-10-CM | POA: Diagnosis not present

## 2014-12-13 DIAGNOSIS — I2510 Atherosclerotic heart disease of native coronary artery without angina pectoris: Secondary | ICD-10-CM | POA: Diagnosis not present

## 2014-12-13 DIAGNOSIS — Z23 Encounter for immunization: Secondary | ICD-10-CM | POA: Diagnosis not present

## 2014-12-13 DIAGNOSIS — I10 Essential (primary) hypertension: Secondary | ICD-10-CM | POA: Diagnosis not present

## 2014-12-13 DIAGNOSIS — C189 Malignant neoplasm of colon, unspecified: Secondary | ICD-10-CM | POA: Diagnosis not present

## 2014-12-13 DIAGNOSIS — J309 Allergic rhinitis, unspecified: Secondary | ICD-10-CM | POA: Diagnosis not present

## 2014-12-27 DIAGNOSIS — Z23 Encounter for immunization: Secondary | ICD-10-CM | POA: Diagnosis not present

## 2015-03-07 DIAGNOSIS — H401121 Primary open-angle glaucoma, left eye, mild stage: Secondary | ICD-10-CM | POA: Diagnosis not present

## 2015-03-07 DIAGNOSIS — L570 Actinic keratosis: Secondary | ICD-10-CM | POA: Diagnosis not present

## 2015-03-07 DIAGNOSIS — H2512 Age-related nuclear cataract, left eye: Secondary | ICD-10-CM | POA: Diagnosis not present

## 2015-03-07 DIAGNOSIS — Z85828 Personal history of other malignant neoplasm of skin: Secondary | ICD-10-CM | POA: Diagnosis not present

## 2015-03-07 DIAGNOSIS — H538 Other visual disturbances: Secondary | ICD-10-CM | POA: Diagnosis not present

## 2015-03-07 DIAGNOSIS — H401113 Primary open-angle glaucoma, right eye, severe stage: Secondary | ICD-10-CM | POA: Diagnosis not present

## 2015-05-28 DIAGNOSIS — E785 Hyperlipidemia, unspecified: Secondary | ICD-10-CM | POA: Diagnosis not present

## 2015-05-28 DIAGNOSIS — Z125 Encounter for screening for malignant neoplasm of prostate: Secondary | ICD-10-CM | POA: Diagnosis not present

## 2015-05-28 DIAGNOSIS — F17200 Nicotine dependence, unspecified, uncomplicated: Secondary | ICD-10-CM | POA: Diagnosis not present

## 2015-05-28 DIAGNOSIS — C189 Malignant neoplasm of colon, unspecified: Secondary | ICD-10-CM | POA: Diagnosis not present

## 2015-05-28 DIAGNOSIS — I10 Essential (primary) hypertension: Secondary | ICD-10-CM | POA: Diagnosis not present

## 2015-05-28 DIAGNOSIS — I2510 Atherosclerotic heart disease of native coronary artery without angina pectoris: Secondary | ICD-10-CM | POA: Diagnosis not present

## 2015-05-30 DIAGNOSIS — H538 Other visual disturbances: Secondary | ICD-10-CM | POA: Diagnosis not present

## 2015-05-30 DIAGNOSIS — H2512 Age-related nuclear cataract, left eye: Secondary | ICD-10-CM | POA: Diagnosis not present

## 2015-06-15 DIAGNOSIS — Z0000 Encounter for general adult medical examination without abnormal findings: Secondary | ICD-10-CM | POA: Diagnosis not present

## 2015-06-18 NOTE — Patient Instructions (Signed)
Your procedure is scheduled on:  06/25/2015               Report to [facility] at  6:15   AM.  Call this number if you have problems the morning of surgery: [phone]   Remember:   Do not eat or drink :After Midnight.    Take these medicines the morning of surgery with A SIP OF WATER:  Amlodipine          Do not wear jewelry, make-up or nail polish.  Do not wear lotions, powders, or perfumes. You may wear deodorant.  Do not shave 48 hours prior to surgery.  Do not bring valuables to the hospital.  Contacts, dentures or bridgework may not be worn into surgery.  Patients discharged the day of surgery will not be allowed to drive home.  Name and phone number of your driver:    @[email or web address] @ Cataract Surgery  A cataract is a clouding of the lens of the eye. When a lens becomes cloudy, vision is reduced based on the degree and nature of the clouding. Surgery may be needed to improve vision. Surgery removes the cloudy lens and usually replaces it with a substitute lens (intraocular lens, IOL). LET YOUR EYE DOCTOR KNOW ABOUT:  Allergies to food or medicine.   Medicines taken including herbs, eyedrops, over-the-counter medicines, and creams.   Use of steroids (by mouth or creams).   Previous problems with anesthetics or numbing medicine.   History of bleeding problems or blood clots.   Previous surgery.   Other health problems, including diabetes and kidney problems.   Possibility of pregnancy, if this applies.  RISKS AND COMPLICATIONS  Infection.   Inflammation of the eyeball (endophthalmitis) that can spread to both eyes (sympathetic ophthalmia).   Poor wound healing.   If an IOL is inserted, it can later fall out of proper position. This is very uncommon.   Clouding of the part of your eye that holds an IOL in place. This is called an "after-cataract." These are uncommon, but easily treated.  BEFORE THE PROCEDURE  Do not eat or drink anything except small amounts of  water for 8 to 12 before your surgery, or as directed by your caregiver.   Unless you are told otherwise, continue any eyedrops you have been prescribed.   Talk to your primary caregiver about all other medicines that you take (both prescription and non-prescription). In some cases, you may need to stop or change medicines near the time of your surgery. This is most important if you are taking blood-thinning medicine.Do not stop medicines unless you are told to do so.   Arrange for someone to drive you to and from the procedure.   Do not put contact lenses in either eye on the day of your surgery.  PROCEDURE There is more than one method for safely removing a cataract. Your doctor can explain the differences and help determine which is best for you. Phacoemulsification surgery is the most common form of cataract surgery.  An injection is given behind the eye or eyedrops are given to make this a painless procedure.   A small cut (incision) is made on the edge of the clear, dome-shaped surface that covers the front of the eye (cornea).   A tiny probe is painlessly inserted into the eye. This device gives off ultrasound waves that soften and break up the cloudy center of the lens. This makes it easier for the cloudy lens to be removed  by suction.   An IOL may be implanted.   The normal lens of the eye is covered by a clear capsule. Part of that capsule is intentionally left in the eye to support the IOL.   Your surgeon may or may not use stitches to close the incision.  There are other forms of cataract surgery that require a larger incision and stiches to close the eye. This approach is taken in cases where the doctor feels that the cataract cannot be easily removed using phacoemulsification. AFTER THE PROCEDURE  When an IOL is implanted, it does not need care. It becomes a permanent part of your eye and cannot be seen or felt.   Your doctor will schedule follow-up exams to check on your  progress.   Review your other medicines with your doctor to see which can be resumed after surgery.   Use eyedrops or take medicine as prescribed by your doctor.  Document Released: 03/27/2011 Document Reviewed: 03/24/2011 [facility] Patient Information 2012 [place].  .Cataract Surgery Care After Refer to this sheet in the next few weeks. These instructions provide you with information on caring for yourself after your procedure. Your caregiver may also give you more specific instructions. Your treatment has been planned according to current medical practices, but problems sometimes occur. Call your caregiver if you have any problems or questions after your procedure.  HOME CARE INSTRUCTIONS   Avoid strenuous activities as directed by your caregiver.   Ask your caregiver when you can resume driving.   Use eyedrops or other medicines to help healing and control pressure inside your eye as directed by your caregiver.   Only take over-the-counter or prescription medicines for pain, discomfort, or fever as directed by your caregiver.   Do not to touch or rub your eyes.   You may be instructed to use a protective shield during the first few days and nights after surgery. If not, wear sunglasses to protect your eyes. This is to protect the eye from pressure or from being accidentally bumped.   Keep the area around your eye clean and dry. Avoid swimming or allowing water to hit you directly in the face while showering. Keep soap and shampoo out of your eyes.   Do not bend or lift heavy objects. Bending increases pressure in the eye. You can walk, climb stairs, and do light household chores.   Do not put a contact lens into the eye that had surgery until your caregiver says it is okay to do so.   Ask your doctor when you can return to work. This will depend on the kind of work that you do. If you work in a dusty environment, you may be advised to wear protective eyewear for a period of  time.   Ask your caregiver when it will be safe to engage in sexual activity.   Continue with your regular eye exams as directed by your caregiver.  What to expect:  It is normal to feel itching and mild discomfort for a few days after cataract surgery. Some fluid discharge is also common, and your eye may be sensitive to light and touch.   After 1 to 2 days, even moderate discomfort should disappear. In most cases, healing will take about 6 weeks.   If you received an intraocular lens (IOL), you may notice that colors are very bright or have a blue tinge. Also, if you have been in bright sunlight, everything may appear reddish for a few hours. If  you see these color tinges, it is because your lens is clear and no longer cloudy. Within a few months after receiving an IOL, these extra colors should go away. When you have healed, you will probably need new glasses.  SEEK MEDICAL CARE IF:   You have increased bruising around your eye.   You have discomfort not helped by medicine.  SEEK IMMEDIATE MEDICAL CARE IF:   You have a fever.   You have a worsening or sudden vision loss.   You have redness, swelling, or increasing pain in the eye.   You have a thick discharge from the eye that had surgery.  MAKE SURE YOU:  Understand these instructions.   Will watch your condition.   Will get help right away if you are not doing well or get worse.  Document Released: 10/25/2004 Document Revised: 03/27/2011 Document Reviewed: 11/29/2010 [facility] Patient Information 2012 [place].    Monitored Anesthesia Care  Monitored anesthesia care is an anesthesia service for a medical procedure. Anesthesia is the loss of the ability to feel pain. It is produced by medications called anesthetics. It may affect a small area of your body (local anesthesia), a large area of your body (regional anesthesia), or your entire body (general anesthesia). The need for monitored anesthesia care depends your  procedure, your condition, and the potential need for regional or general anesthesia. It is often provided during procedures where:   General anesthesia may be needed if there are complications. This is because you need special care when you are under general anesthesia.   You will be under local or regional anesthesia. This is so that you are able to have higher levels of anesthesia if needed.   You will receive calming medications (sedatives). This is especially the case if sedatives are given to put you in a semi-conscious state of relaxation (deep sedation). This is because the amount of sedative needed to produce this state can be hard to predict. Too much of a sedative can produce general anesthesia. Monitored anesthesia care is performed by one or more caregivers who have special training in all types of anesthesia. You will need to meet with these caregivers before your procedure. During this meeting, they will ask you about your medical history. They will also give you instructions to follow. (For example, you will need to stop eating and drinking before your procedure. You may also need to stop or change medications you are taking.) During your procedure, your caregivers will stay with you. They will:   Watch your condition. This includes watching you blood pressure, breathing, and level of pain.   Diagnose and treat problems that occur.   Give medications if they are needed. These may include calming medications (sedatives) and anesthetics.   Make sure you are comfortable.  Having monitored anesthesia care does not necessarily mean that you will be under anesthesia. It does mean that your caregivers will be able to manage anesthesia if you need it or if it occurs. It also means that you will be able to have a different type of anesthesia than you are having if you need it. When your procedure is complete, your caregivers will continue to watch your condition. They will make sure any  medications wear off before you are allowed to go home.  Document Released: 01/01/2005 Document Revised: 08/02/2012 Document Reviewed: 05/19/2012 [facility] Patient Information 2014 [place], [place].

## 2015-06-19 ENCOUNTER — Encounter (HOSPITAL_COMMUNITY): Payer: Self-pay

## 2015-06-19 ENCOUNTER — Other Ambulatory Visit: Payer: Self-pay

## 2015-06-19 ENCOUNTER — Encounter (HOSPITAL_COMMUNITY)
Admission: RE | Admit: 2015-06-19 | Discharge: 2015-06-19 | Disposition: A | Discharge status: Home / Self Care | Payer: Medicare Other | Source: Ambulatory Visit | Attending: Ophthalmology | Admitting: Ophthalmology

## 2015-06-19 DIAGNOSIS — I10 Essential (primary) hypertension: Secondary | ICD-10-CM | POA: Diagnosis not present

## 2015-06-19 DIAGNOSIS — Z01810 Encounter for preprocedural cardiovascular examination: Secondary | ICD-10-CM | POA: Insufficient documentation

## 2015-06-19 HISTORY — DX: Malignant (primary) neoplasm, unspecified: C80.1

## 2015-06-22 NOTE — OR Nursing (Signed)
EKG shown to Dr. Patsey Berthold and ok to proceed  with surgery.

## 2015-06-25 ENCOUNTER — Encounter (HOSPITAL_COMMUNITY): Payer: Self-pay

## 2015-06-25 ENCOUNTER — Ambulatory Visit (HOSPITAL_COMMUNITY): Payer: Medicare Other | Admitting: Anesthesiology

## 2015-06-25 ENCOUNTER — Encounter (HOSPITAL_COMMUNITY)
Admission: RE | Disposition: A | Discharge status: Home / Self Care | Payer: Self-pay | Source: Ambulatory Visit | Attending: Ophthalmology

## 2015-06-25 ENCOUNTER — Ambulatory Visit (HOSPITAL_COMMUNITY)
Admission: RE | Admit: 2015-06-25 | Discharge: 2015-06-25 | Disposition: A | Discharge status: Home / Self Care | Payer: Medicare Other | Source: Ambulatory Visit | Attending: Ophthalmology | Admitting: Ophthalmology

## 2015-06-25 DIAGNOSIS — Z7982 Long term (current) use of aspirin: Secondary | ICD-10-CM | POA: Diagnosis not present

## 2015-06-25 DIAGNOSIS — Z79899 Other long term (current) drug therapy: Secondary | ICD-10-CM | POA: Diagnosis not present

## 2015-06-25 DIAGNOSIS — Z951 Presence of aortocoronary bypass graft: Secondary | ICD-10-CM | POA: Insufficient documentation

## 2015-06-25 DIAGNOSIS — I2510 Atherosclerotic heart disease of native coronary artery without angina pectoris: Secondary | ICD-10-CM | POA: Diagnosis not present

## 2015-06-25 DIAGNOSIS — H2512 Age-related nuclear cataract, left eye: Secondary | ICD-10-CM | POA: Diagnosis not present

## 2015-06-25 DIAGNOSIS — H269 Unspecified cataract: Secondary | ICD-10-CM | POA: Diagnosis not present

## 2015-06-25 DIAGNOSIS — H268 Other specified cataract: Secondary | ICD-10-CM | POA: Insufficient documentation

## 2015-06-25 DIAGNOSIS — H538 Other visual disturbances: Secondary | ICD-10-CM | POA: Diagnosis not present

## 2015-06-25 DIAGNOSIS — I10 Essential (primary) hypertension: Secondary | ICD-10-CM | POA: Diagnosis not present

## 2015-06-25 HISTORY — PX: CATARACT EXTRACTION W/PHACO: SHX586

## 2015-06-25 SURGERY — PHACOEMULSIFICATION, CATARACT, WITH IOL INSERTION
Anesthesia: Monitor Anesthesia Care | Site: Eye | Laterality: Left

## 2015-06-25 MED ORDER — TETRACAINE HCL 0.5 % OP SOLN
1.0000 [drp] | OPHTHALMIC | Status: AC | PRN
  Administered 2015-06-25 (×3): 1 [drp] via OPHTHALMIC

## 2015-06-25 MED ORDER — LIDOCAINE HCL 3.5 % OP GEL
1.0000 "application " | Freq: Once | OPHTHALMIC | Status: AC
  Administered 2015-06-25: 1 via OPHTHALMIC

## 2015-06-25 MED ORDER — MIDAZOLAM HCL 2 MG/2ML IJ SOLN
1.0000 mg | INTRAMUSCULAR | Status: DC | PRN
  Administered 2015-06-25: 2 mg via INTRAVENOUS

## 2015-06-25 MED ORDER — PHENYLEPHRINE-KETOROLAC 1-0.3 % IO SOLN
INTRAOCULAR | Status: DC | PRN
  Administered 2015-06-25: 500 mL via OPHTHALMIC

## 2015-06-25 MED ORDER — PHENYLEPHRINE-KETOROLAC 1-0.3 % IO SOLN
INTRAOCULAR | Status: AC
  Filled 2015-06-25: qty 4

## 2015-06-25 MED ORDER — BSS IO SOLN
INTRAOCULAR | Status: DC | PRN
  Administered 2015-06-25: 15 mL via INTRAOCULAR

## 2015-06-25 MED ORDER — CYCLOPENTOLATE-PHENYLEPHRINE 0.2-1 % OP SOLN
1.0000 [drp] | OPHTHALMIC | Status: AC | PRN
  Administered 2015-06-25 (×3): 1 [drp] via OPHTHALMIC

## 2015-06-25 MED ORDER — POVIDONE-IODINE 5 % OP SOLN
OPHTHALMIC | Status: DC | PRN
  Administered 2015-06-25: 1 via OPHTHALMIC

## 2015-06-25 MED ORDER — LIDOCAINE HCL 3.5 % OP GEL
OPHTHALMIC | Status: DC | PRN
  Administered 2015-06-25: 1 via OPHTHALMIC

## 2015-06-25 MED ORDER — LACTATED RINGERS IV SOLN
INTRAVENOUS | Status: DC
  Administered 2015-06-25: 07:00:00 via INTRAVENOUS

## 2015-06-25 MED ORDER — MIDAZOLAM HCL 2 MG/2ML IJ SOLN
INTRAMUSCULAR | Status: AC
  Filled 2015-06-25: qty 2

## 2015-06-25 MED ORDER — NA HYALUR & NA CHOND-NA HYALUR 0.55-0.5 ML IO KIT
PACK | INTRAOCULAR | Status: DC | PRN
  Administered 2015-06-25: 1 via OPHTHALMIC

## 2015-06-25 MED ORDER — TETRACAINE 0.5 % OP SOLN OPTIME - NO CHARGE
OPHTHALMIC | Status: DC | PRN
  Administered 2015-06-25: 1 [drp] via OPHTHALMIC

## 2015-06-25 SURGICAL SUPPLY — 28 items
CAPSULAR TENSION RING-AMO (OPHTHALMIC RELATED) IMPLANT
CLOTH BEACON ORANGE TIMEOUT ST (SAFETY) ×3 IMPLANT
GLOVE BIO SURGEON STRL SZ7.5 (GLOVE) IMPLANT
GLOVE BIOGEL M 6.5 STRL (GLOVE) IMPLANT
GLOVE BIOGEL PI IND STRL 6.5 (GLOVE) ×1 IMPLANT
GLOVE BIOGEL PI IND STRL 7.0 (GLOVE) IMPLANT
GLOVE BIOGEL PI INDICATOR 6.5 (GLOVE) ×2
GLOVE BIOGEL PI INDICATOR 7.0 (GLOVE)
GLOVE ECLIPSE 6.5 STRL STRAW (GLOVE) IMPLANT
GLOVE ECLIPSE 7.5 STRL STRAW (GLOVE) IMPLANT
GLOVE EXAM NITRILE LRG STRL (GLOVE) IMPLANT
GLOVE EXAM NITRILE MD LF STRL (GLOVE) ×3 IMPLANT
GLOVE SKINSENSE NS SZ6.5 (GLOVE)
GLOVE SKINSENSE NS SZ7.0 (GLOVE)
GLOVE SKINSENSE STRL SZ6.5 (GLOVE) IMPLANT
GLOVE SKINSENSE STRL SZ7.0 (GLOVE) IMPLANT
INST SET CATARACT ~~LOC~~ (KITS) ×3 IMPLANT
KIT VITRECTOMY (OPHTHALMIC RELATED) IMPLANT
LENS ALC ACRYL/TECN (Ophthalmic Related) ×3 IMPLANT
PAD ARMBOARD 7.5X6 YLW CONV (MISCELLANEOUS) ×3 IMPLANT
PROC W NO LENS (INTRAOCULAR LENS)
PROC W SPEC LENS (INTRAOCULAR LENS)
PROCESS W NO LENS (INTRAOCULAR LENS) IMPLANT
PROCESS W SPEC LENS (INTRAOCULAR LENS) IMPLANT
RETRACTOR IRIS SIGHTPATH (OPHTHALMIC RELATED) IMPLANT
RING MALYGIN (MISCELLANEOUS) IMPLANT
VISCOELASTIC ADDITIONAL (OPHTHALMIC RELATED) IMPLANT
WATER STERILE IRR 250ML POUR (IV SOLUTION) ×3 IMPLANT

## 2015-06-25 NOTE — H&P (Signed)
I have reviewed the pre printed H&P, the patient was re-examined, and I have identified no significant interval changes in the patient's medical condition.  There is no change in the plan of care since the history and physical of record. 

## 2015-06-25 NOTE — Anesthesia Preprocedure Evaluation (Signed)
Anesthesia Evaluation  Patient identified by MRN, date of birth, ID band Patient awake    Reviewed: Allergy & Precautions, NPO status , Patient's Chart, lab work & pertinent test results  Airway Mallampati: II  TM Distance: >3 FB Neck ROM: Full    Dental  (+) Upper Dentures, Lower Dentures, Edentulous Upper, Edentulous Lower   Pulmonary    Pulmonary exam normal        Cardiovascular hypertension, Pt. on medications + CAD and + CABG   Rhythm:Irregular     Neuro/Psych    GI/Hepatic   Endo/Other    Renal/GU      Musculoskeletal   Abdominal Normal abdominal exam  (+) + scaphoid   Peds  Hematology   Anesthesia Other Findings   Reproductive/Obstetrics                             Anesthesia Physical Anesthesia Plan  ASA: III  Anesthesia Plan: MAC   Post-op Pain Management:    Induction: Intravenous  Airway Management Planned: Nasal Cannula  Additional Equipment:   Intra-op Plan:   Post-operative Plan:   Informed Consent: I have reviewed the patients History and Physical, chart, labs and discussed the procedure including the risks, benefits and alternatives for the proposed anesthesia with the patient or authorized representative who has indicated his/her understanding and acceptance.     Plan Discussed with: CRNA  Anesthesia Plan Comments:         Anesthesia Quick Evaluation

## 2015-06-25 NOTE — Anesthesia Postprocedure Evaluation (Signed)
Anesthesia Post Note  Patient: [name]  Procedure(s) Performed: Procedure(s) (LRB): CATARACT EXTRACTION PHACO AND INTRAOCULAR LENS PLACEMENT; CDE:  4.69 (Left)  Patient location during evaluation: Short Stay Anesthesia Type: MAC Level of consciousness: awake and alert and patient cooperative Pain management: pain level controlled Vital Signs Assessment: post-procedure vital signs reviewed and stable Respiratory status: nonlabored ventilation and spontaneous breathing Cardiovascular status: stable and bradycardic Postop Assessment: no signs of nausea or vomiting Anesthetic complications: no    Last Vitals:  Filed Vitals:   06/25/15 0710 06/25/15 0715  BP: 142/74 128/56  Pulse:    Temp:    Resp: 23 18    Last Pain: There were no vitals filed for this visit.               Hilario Robarts J

## 2015-06-25 NOTE — Transfer of Care (Signed)
Immediate Anesthesia Transfer of Care Note  Patient: [name]  Procedure(s) Performed: Procedure(s): CATARACT EXTRACTION PHACO AND INTRAOCULAR LENS PLACEMENT; CDE:  4.69 (Left)  Patient Location: Short Stay  Anesthesia Type:MAC  Level of Consciousness: awake and patient cooperative  Airway & Oxygen Therapy: Patient Spontanous Breathing  Post-op Assessment: Report given to RN, Post -op Vital signs reviewed and stable and Patient moving all extremities  Post vital signs: Reviewed and stable  Last Vitals:  Filed Vitals:   06/25/15 0710 06/25/15 0715  BP: 142/74 128/56  Pulse:    Temp:    Resp: 23 18    Complications: No apparent anesthesia complications

## 2015-06-25 NOTE — Brief Op Note (Signed)
06/25/2015  8:49 AM  PATIENT:  [name]  81 y.o. male  PRE-OPERATIVE DIAGNOSIS:  nuclear cataract left eye  POST-OPERATIVE DIAGNOSIS:  nuclear cataract left eye  PROCEDURE:  Procedure(s): CATARACT EXTRACTION PHACO AND INTRAOCULAR LENS PLACEMENT; CDE:  4.69  SURGEON:  Surgeon(s): Williams Che, MD  ASSISTANTS:  Zoila Shutter, CST  ANESTHESIA STAFF: Anesthesiologist: Rusty Aus, MD CRNA: Charmaine Downs, CRNA  ANESTHESIA:   topical and MAC  REQUESTED LENS POWER: 21.5  LENS IMPLANT INFORMATION:  Alcon SN60WF  S/n [id number]   Exp 10/2019  +21.5  CUMULATIVE DISSIPATED ENERGY:4.69  INDICATIONS:see scanned office H&P  OP FINDINGS:dense NS  COMPLICATIONS:None  DICTATION #: none  PLAN OF CARE:   As above  PATIENT DISPOSITION:  Short Stay

## 2015-06-25 NOTE — Discharge Instructions (Signed)
[name] 06/25/2015 Dr. Iona Hansen Post operative Instructions for Cataract Patients  These instructions are for [name] and pertain to the operative eye.  1.  Resume your normal diet and previous oral medicines.  2. Your Follow-up appointment is at Dr. Iona Hansen' office in [place] on 06/26/15 @ 1115.  3. You may leave the hospital when your driver is present and your nurse releases you.  4. Begin Pred Forte (prednisolone acetate 1%), Acular LS (ketorolac tromethamine .4%) and Gatifloxacin 0.5% eye drops; 1 drop each 4 times daily to operative eye. Begin 3 hours after discharge from Short Stay Unit.  Moxifloxacin 0.5% may be substituted for Gatifloxacin using the same instructions.  24. Page Dr. Iona Hansen via beeper [phone] for significant pain in or around operative eye that is not relieved by Tylenol.  6. If you took Plavix before surgery, restart it at the usual dose on the evening of surgery.  7. Wear dark glasses as necessary for excessive light sensitivity.  8. Do no forcefully rub you your operative eye.  9. Keep your operative eye dry for 1 week. You may gently clean your eyelids with a damp washcloth.  10. You may resume normal occupational activities in one week and resume driving as tolerated after the first post operative visit.  11. It is normal to have blurred vision and a scratchy sensation following surgery.  Dr. Iona Hansen: [phone]

## 2015-06-25 NOTE — Op Note (Signed)
06/25/2015  8:49 AM  PATIENT:  [name]  81 y.o. male  PRE-OPERATIVE DIAGNOSIS:  nuclear cataract left eye  POST-OPERATIVE DIAGNOSIS:  nuclear cataract left eye  PROCEDURE:  Procedure(s): CATARACT EXTRACTION PHACO AND INTRAOCULAR LENS PLACEMENT; CDE:  4.69  SURGEON:  Surgeon(s): Williams Che, MD  ASSISTANTS:  Zoila Shutter, CST  ANESTHESIA STAFF: Anesthesiologist: Rusty Aus, MD CRNA: Charmaine Downs, CRNA  ANESTHESIA:   topical and MAC  REQUESTED LENS POWER: 21.5  LENS IMPLANT INFORMATION:  Alcon SN60WF  S/n [id number]   Exp 10/2019  +21.5  CUMULATIVE DISSIPATED ENERGY:4.69  INDICATIONS:see scanned office H&P  OP FINDINGS:dense NS  COMPLICATIONS:None  PROCEDURE:  The patient was brought to the operating room in good condition.  The operative eye was prepped and draped in the usual fashion for intraocular surgery.  Lidocaine gel was dropped onto the eye.  A 2.4 mm 10 O'clock near clear corneal stepped incision and a 12 O'clock stab incision were created.  Viscoat was instilled into the anterior chamber.  The 5 mm anterior capsulorhexis was performed with a bent needle cystotome and Utrata forceps.  The lens was hydrodissected and hydrodelineated with a cannula and balanced salt solution and rotated with a Kuglen hook.  Phacoemulsification was perfomed in the divide and conquer technique.  The remaining cortex was removed with I&A and the capsular surfaces polished as necessary.  Provisc was placed into the capsular bag and the lens inserted with the Alcon inserter.  The viscoelastic was removed with I&A and the lens "rocked" into position.  The wounds were hydrated and te anterior chamber was refilled with balanced salt solution.  The wounds were checked for leakage and rehydrated as necessary.  The lid speculum and drapes were removed and the patient was transported to short stay in good condition.  PATIENT DISPOSITION:  Short Stay

## 2015-06-26 ENCOUNTER — Encounter (HOSPITAL_COMMUNITY): Payer: Self-pay | Admitting: Ophthalmology

## 2015-09-10 DIAGNOSIS — Z85828 Personal history of other malignant neoplasm of skin: Secondary | ICD-10-CM | POA: Diagnosis not present

## 2015-09-10 DIAGNOSIS — L570 Actinic keratosis: Secondary | ICD-10-CM | POA: Diagnosis not present

## 2016-01-16 DIAGNOSIS — Z23 Encounter for immunization: Secondary | ICD-10-CM | POA: Diagnosis not present

## 2016-03-17 DIAGNOSIS — L570 Actinic keratosis: Secondary | ICD-10-CM | POA: Diagnosis not present

## 2016-03-17 DIAGNOSIS — Z85828 Personal history of other malignant neoplasm of skin: Secondary | ICD-10-CM | POA: Diagnosis not present

## 2016-03-17 DIAGNOSIS — D485 Neoplasm of uncertain behavior of skin: Secondary | ICD-10-CM | POA: Diagnosis not present

## 2016-03-17 DIAGNOSIS — C44311 Basal cell carcinoma of skin of nose: Secondary | ICD-10-CM | POA: Diagnosis not present

## 2016-04-03 DIAGNOSIS — C44311 Basal cell carcinoma of skin of nose: Secondary | ICD-10-CM | POA: Diagnosis not present

## 2016-05-26 DIAGNOSIS — Z0000 Encounter for general adult medical examination without abnormal findings: Secondary | ICD-10-CM | POA: Diagnosis not present

## 2016-05-26 DIAGNOSIS — C189 Malignant neoplasm of colon, unspecified: Secondary | ICD-10-CM | POA: Diagnosis not present

## 2016-05-26 DIAGNOSIS — Z125 Encounter for screening for malignant neoplasm of prostate: Secondary | ICD-10-CM | POA: Diagnosis not present

## 2016-05-26 DIAGNOSIS — E785 Hyperlipidemia, unspecified: Secondary | ICD-10-CM | POA: Diagnosis not present

## 2016-05-26 DIAGNOSIS — I2510 Atherosclerotic heart disease of native coronary artery without angina pectoris: Secondary | ICD-10-CM | POA: Diagnosis not present

## 2016-05-26 DIAGNOSIS — F17200 Nicotine dependence, unspecified, uncomplicated: Secondary | ICD-10-CM | POA: Diagnosis not present

## 2016-05-26 DIAGNOSIS — I10 Essential (primary) hypertension: Secondary | ICD-10-CM | POA: Diagnosis not present

## 2016-06-16 DIAGNOSIS — Z0000 Encounter for general adult medical examination without abnormal findings: Secondary | ICD-10-CM | POA: Diagnosis not present

## 2016-06-24 DIAGNOSIS — H5213 Myopia, bilateral: Secondary | ICD-10-CM | POA: Diagnosis not present

## 2016-06-24 DIAGNOSIS — Z961 Presence of intraocular lens: Secondary | ICD-10-CM | POA: Diagnosis not present

## 2016-06-24 DIAGNOSIS — H524 Presbyopia: Secondary | ICD-10-CM | POA: Diagnosis not present

## 2016-06-24 DIAGNOSIS — H52223 Regular astigmatism, bilateral: Secondary | ICD-10-CM | POA: Diagnosis not present

## 2017-01-16 DIAGNOSIS — Z23 Encounter for immunization: Secondary | ICD-10-CM | POA: Diagnosis not present

## 2017-03-17 DIAGNOSIS — L570 Actinic keratosis: Secondary | ICD-10-CM | POA: Diagnosis not present

## 2017-03-17 DIAGNOSIS — Z85828 Personal history of other malignant neoplasm of skin: Secondary | ICD-10-CM | POA: Diagnosis not present

## 2017-05-25 DIAGNOSIS — E785 Hyperlipidemia, unspecified: Secondary | ICD-10-CM | POA: Diagnosis not present

## 2017-05-25 DIAGNOSIS — C189 Malignant neoplasm of colon, unspecified: Secondary | ICD-10-CM | POA: Diagnosis not present

## 2017-05-25 DIAGNOSIS — I10 Essential (primary) hypertension: Secondary | ICD-10-CM | POA: Diagnosis not present

## 2017-05-25 DIAGNOSIS — Z125 Encounter for screening for malignant neoplasm of prostate: Secondary | ICD-10-CM | POA: Diagnosis not present

## 2017-05-25 DIAGNOSIS — F17200 Nicotine dependence, unspecified, uncomplicated: Secondary | ICD-10-CM | POA: Diagnosis not present

## 2017-05-25 DIAGNOSIS — I2510 Atherosclerotic heart disease of native coronary artery without angina pectoris: Secondary | ICD-10-CM | POA: Diagnosis not present

## 2017-06-17 DIAGNOSIS — Z0000 Encounter for general adult medical examination without abnormal findings: Secondary | ICD-10-CM | POA: Diagnosis not present

## 2018-01-13 DIAGNOSIS — L570 Actinic keratosis: Secondary | ICD-10-CM | POA: Diagnosis not present

## 2018-01-13 DIAGNOSIS — Z85828 Personal history of other malignant neoplasm of skin: Secondary | ICD-10-CM | POA: Diagnosis not present

## 2018-01-13 DIAGNOSIS — L821 Other seborrheic keratosis: Secondary | ICD-10-CM | POA: Diagnosis not present

## 2018-01-27 DIAGNOSIS — Z23 Encounter for immunization: Secondary | ICD-10-CM | POA: Diagnosis not present

## 2018-03-15 DIAGNOSIS — I2510 Atherosclerotic heart disease of native coronary artery without angina pectoris: Secondary | ICD-10-CM | POA: Diagnosis not present

## 2018-03-15 DIAGNOSIS — F419 Anxiety disorder, unspecified: Secondary | ICD-10-CM | POA: Diagnosis not present

## 2018-03-15 DIAGNOSIS — I10 Essential (primary) hypertension: Secondary | ICD-10-CM | POA: Diagnosis not present

## 2018-03-15 DIAGNOSIS — E785 Hyperlipidemia, unspecified: Secondary | ICD-10-CM | POA: Diagnosis not present

## 2018-06-14 DIAGNOSIS — I10 Essential (primary) hypertension: Secondary | ICD-10-CM | POA: Diagnosis not present

## 2018-06-14 DIAGNOSIS — I2510 Atherosclerotic heart disease of native coronary artery without angina pectoris: Secondary | ICD-10-CM | POA: Diagnosis not present

## 2018-06-14 DIAGNOSIS — Z125 Encounter for screening for malignant neoplasm of prostate: Secondary | ICD-10-CM | POA: Diagnosis not present

## 2018-06-14 DIAGNOSIS — E785 Hyperlipidemia, unspecified: Secondary | ICD-10-CM | POA: Diagnosis not present

## 2018-06-14 DIAGNOSIS — C189 Malignant neoplasm of colon, unspecified: Secondary | ICD-10-CM | POA: Diagnosis not present

## 2018-06-14 DIAGNOSIS — Z0000 Encounter for general adult medical examination without abnormal findings: Secondary | ICD-10-CM | POA: Diagnosis not present

## 2018-11-22 ENCOUNTER — Other Ambulatory Visit: Payer: Self-pay

## 2019-01-25 DIAGNOSIS — Z85820 Personal history of malignant melanoma of skin: Secondary | ICD-10-CM | POA: Diagnosis not present

## 2019-01-25 DIAGNOSIS — L570 Actinic keratosis: Secondary | ICD-10-CM | POA: Diagnosis not present

## 2019-01-25 DIAGNOSIS — L821 Other seborrheic keratosis: Secondary | ICD-10-CM | POA: Diagnosis not present

## 2019-01-26 DIAGNOSIS — Z23 Encounter for immunization: Secondary | ICD-10-CM | POA: Diagnosis not present

## 2019-05-03 ENCOUNTER — Encounter: Payer: Self-pay | Admitting: Cardiology

## 2019-05-03 NOTE — Progress Notes (Signed)
Cardiology Office Note  Date: 05/04/2019   ID: [name], DOB 24-Aug-1934, MRN [id number]  PCP:  Celene Squibb, MD  Cardiologist:  Electrophysiologist:  None   Chief Complaint  Patient presents with  . Atrial Fibrillation    History of Present Illness: [name] is an 85 y.o. male former patient of Dr. Luan Pulling now establishing with Dr. Nevada Crane and referred for cardiology consultation with recent documentation of atrial fibrillation during office visit on January 11.  He was followed in the past by Dr. Verl Blalock, history of CAD status post CABG in 1999.  States that he has done very well over time, has not had recent cardiology follow-up however.  He is not aware of any sense of palpitations, has had no sudden dizziness or syncope.  Also no obvious angina symptoms on current medical therapy.  I reviewed a relatively poor quality fax of ECG from January 11 which shows significant lead motion artifact, computer interpreted atrial fibrillation.  ECG from 2017 showed slow atrial fibrillation.  Tracing repeated today confirms rate controlled atrial fibrillation.  CHA2DS2-VASc score is 4.  I talked with him about the risk of stroke and atrial fibrillation, indications for anticoagulation instead of aspirin, also bleeding risk.  He was sent a prescription for Eliquis per Dr. Nevada Crane, has not started as yet.  States that he is in agreement to give this a try in lieu of aspirin.  Also getting baseline lab work per Dr. Nevada Crane.  He does not describe any history of GI bleeding, does have a colostomy.  Patient is retired from the [employer], previously owned a service station as well.  Past Medical History:  Diagnosis Date  . CAD (coronary artery disease)    Status post CABG 1999  . Essential hypertension   . Hyperlipidemia   . Rectal cancer [facility])     Past Surgical History:  Procedure Laterality Date  . CATARACT EXTRACTION W/PHACO Left 06/25/2015   Procedure: CATARACT EXTRACTION  PHACO AND INTRAOCULAR LENS PLACEMENT; CDE:  4.69;  Surgeon: Williams Che, MD;  Location: [facility];  Service: Ophthalmology;  Laterality: Left;  . COLOSTOMY    . CORONARY ARTERY BYPASS GRAFT  1999  . RECTAL SURGERY      Current Outpatient Medications  Medication Sig Dispense Refill  . amLODipine (NORVASC) 5 MG tablet Take 5 mg by mouth daily.      Marland Kitchen atorvastatin (LIPITOR) 10 MG tablet Take 10 mg by mouth daily.      Marland Kitchen ELIQUIS 5 MG TABS tablet Take 5 mg by mouth 2 (two) times daily.    . fish oil-omega-3 fatty acids 1000 MG capsule Take 1 capsule by mouth daily.      . folic acid (FOLVITE) A999333 MCG tablet Take 400 mcg by mouth daily.    . Multiple Vitamin (MULTIVITAMIN) tablet Take 1 tablet by mouth daily.      Marland Kitchen telmisartan (MICARDIS) 40 MG tablet Take 40 mg by mouth daily.       No current facility-administered medications for this visit.   Allergies:  Patient has no known allergies.   Social History: The patient  reports that he has quit smoking. His smoking use included cigarettes. His smokeless tobacco use includes chew. He reports that he does not drink alcohol or use drugs.   ROS:  Please see the history of present illness. Otherwise, complete review of systems is positive for hearing loss, arthritic pains.  All other systems are reviewed and  negative.   Physical Exam: VS:  BP (!) 141/60   Pulse 91   Temp 97.8 F (36.6 C)   Ht 5\' 8"  (1.727 m)   Wt 133 lb (60.3 kg)   SpO2 98%   BMI 20.22 kg/m , BMI Body mass index is 20.22 kg/m.  Wt Readings from Last 3 Encounters:  05/04/19 133 lb (60.3 kg)  06/25/15 144 lb (65.3 kg)  06/19/15 144 lb (65.3 kg)    General: Elderly male, appears comfortable at rest. HEENT: Conjunctiva and lids normal, wearing a mask. Neck: Supple, no elevated JVP, bilateral carotid bruits, no thyromegaly. Lungs: Clear to auscultation, nonlabored breathing at rest. Cardiac: Irregularly irregular, no S3, soft systolic murmur, no pericardial  rub. Abdomen: Soft, nontender, bowel sounds present. Extremities: No pitting edema, distal pulses 2+. Skin: Warm and dry. Musculoskeletal: Kyphosis noted. Neuropsychiatric: Alert and oriented x3, affect grossly appropriate.  ECG:  An ECG dated 06/19/2015 was personally reviewed today and demonstrated:  Atrial fibrillation with slow ventricular response.  Recent Labwork: No results found for requested labs within last 8760 hours.     Component Value Date/Time   CHOL 164 11/16/2000 0000   TRIG 112 11/16/2000 0000   HDL 45 11/16/2000 0000   LDLCALC 97 11/16/2000 0000    Other Studies Reviewed Today:  No recent cardiac testing for review.  Assessment and Plan:  1.  Persistent atrial fibrillation, rate controlled and asymptomatic.  Duration is not certain, but this was present by ECG back in 2017 and I suspect this may be long-term.  CHA2DS2-VASc or is 4.  I did talk with him about stroke risk, also bleeding risk with anticoagulation versus aspirin.  He was already given a prescription for Eliquis by Dr. Nevada Crane, I encouraged him to go ahead and start this and stop aspirin.  He will get baseline lab work per Dr. Nevada Crane and we will plan to see him back in the next 6 months with repeat CBC and BMET.  In the meanwhile obtain echocardiogram to assess cardiac structure and function.  2.  Carotid bruits, obtain carotid Dopplers.  3.  Multivessel CAD status post CABG in 1999.  He does not describe any angina symptoms or major functional limitations with typical ADLs.  Continue Norvasc, Micardis, and Lipitor.  He will come off aspirin with initiation of Eliquis.  4.  Mixed hyperlipidemia on Lipitor.  To get baseline lab work per Dr. Nevada Crane.  Medication Adjustments/Labs and Tests Ordered: Current medicines are reviewed at length with the patient today.  Concerns regarding medicines are outlined above.   Tests Ordered: Orders Placed This Encounter  Procedures  . US Carotid Duplex Bilateral  . CBC   . Basic Metabolic Panel (BMET)  . EKG 12-Lead  . ECHOCARDIOGRAM COMPLETE    Medication Changes: No orders of the defined types were placed in this encounter.   Disposition:  Follow up 6 months in the [place] office.  Signed, Satira Sark, MD, [facility] 05/04/2019 1:51 PM    [facility] [address], [place], [place] [address] Phone: [phone]; Fax: [phone]

## 2019-05-04 ENCOUNTER — Encounter: Payer: Self-pay | Admitting: Cardiology

## 2019-05-04 ENCOUNTER — Ambulatory Visit (INDEPENDENT_AMBULATORY_CARE_PROVIDER_SITE_OTHER): Payer: Medicare Other | Admitting: Cardiology

## 2019-05-04 VITALS — BP 141/60 | HR 91 | Temp 97.8°F | Ht 68.0 in | Wt 133.0 lb

## 2019-05-04 DIAGNOSIS — I4819 Other persistent atrial fibrillation: Secondary | ICD-10-CM

## 2019-05-04 DIAGNOSIS — E782 Mixed hyperlipidemia: Secondary | ICD-10-CM | POA: Diagnosis not present

## 2019-05-04 DIAGNOSIS — R0989 Other specified symptoms and signs involving the circulatory and respiratory systems: Secondary | ICD-10-CM

## 2019-05-04 DIAGNOSIS — I10 Essential (primary) hypertension: Secondary | ICD-10-CM | POA: Diagnosis not present

## 2019-05-04 DIAGNOSIS — I25119 Atherosclerotic heart disease of native coronary artery with unspecified angina pectoris: Secondary | ICD-10-CM

## 2019-05-04 DIAGNOSIS — Z79899 Other long term (current) drug therapy: Secondary | ICD-10-CM

## 2019-05-04 NOTE — Patient Instructions (Signed)
Medication Instructions:  STOP Aspirin while you are on Eliquis   *If you need a refill on your cardiac medications before your next appointment, please call your pharmacy*  Lab Work: CBC,BMET JUST BEFORE NEXT VISIT IN 6 MONTHS  If you have labs (blood work) drawn today and your tests are completely normal, you will receive your results only by: Marland Kitchen MyChart Message (if you have MyChart) OR . A paper copy in the mail If you have any lab test that is abnormal or we need to change your treatment, we will call you to review the results.  Testing/Procedures: Your physician has requested that you have an echocardiogram. Echocardiography is a painless test that uses sound waves to create images of your heart. It provides your doctor with information about the size and shape of your heart and how well your heart's chambers and valves are working. This procedure takes approximately one hour. There are no restrictions for this procedure.   Your physician has requested that you have a carotid duplex. This test is an ultrasound of the carotid arteries in your neck. It looks at blood flow through these arteries that supply the brain with blood. Allow one hour for this exam. There are no restrictions or special instructions.   Follow-Up: At [facility], you and your health needs are our priority.  As part of our continuing mission to provide you with exceptional heart care, we have created designated Provider Care Teams.  These Care Teams include your primary Cardiologist (physician) and Advanced Practice Providers (APPs -  Physician Assistants and Nurse Practitioners) who all work together to provide you with the care you need, when you need it.  Your next appointment:   6 month(s)  The format for your next appointment:   In Person  Provider:   Rozann Lesches, MD  Other Instructions NONE    Thank you for choosing [facility] !

## 2019-05-10 ENCOUNTER — Other Ambulatory Visit: Payer: Self-pay

## 2019-05-10 ENCOUNTER — Ambulatory Visit (HOSPITAL_COMMUNITY)
Admission: RE | Admit: 2019-05-10 | Discharge: 2019-05-10 | Disposition: A | Discharge status: Home / Self Care | Payer: Medicare Other | Source: Ambulatory Visit | Attending: Cardiology | Admitting: Cardiology

## 2019-05-10 DIAGNOSIS — R0989 Other specified symptoms and signs involving the circulatory and respiratory systems: Secondary | ICD-10-CM | POA: Insufficient documentation

## 2019-05-10 DIAGNOSIS — I4819 Other persistent atrial fibrillation: Secondary | ICD-10-CM | POA: Insufficient documentation

## 2019-05-10 DIAGNOSIS — I6523 Occlusion and stenosis of bilateral carotid arteries: Secondary | ICD-10-CM | POA: Diagnosis not present

## 2019-05-10 NOTE — Progress Notes (Signed)
*  PRELIMINARY RESULTS* Echocardiogram 2D Echocardiogram has been performed.  [name] 05/10/2019, 12:37 PM

## 2019-05-13 ENCOUNTER — Ambulatory Visit: Payer: No Typology Code available for payment source | Admitting: Cardiology

## 2019-06-20 DIAGNOSIS — E785 Hyperlipidemia, unspecified: Secondary | ICD-10-CM | POA: Diagnosis not present

## 2019-06-20 DIAGNOSIS — I10 Essential (primary) hypertension: Secondary | ICD-10-CM | POA: Diagnosis not present

## 2019-06-20 DIAGNOSIS — R7301 Impaired fasting glucose: Secondary | ICD-10-CM | POA: Diagnosis not present

## 2019-06-20 DIAGNOSIS — Z1329 Encounter for screening for other suspected endocrine disorder: Secondary | ICD-10-CM | POA: Diagnosis not present

## 2019-06-23 DIAGNOSIS — I499 Cardiac arrhythmia, unspecified: Secondary | ICD-10-CM | POA: Diagnosis not present

## 2019-06-23 DIAGNOSIS — Z0001 Encounter for general adult medical examination with abnormal findings: Secondary | ICD-10-CM | POA: Diagnosis not present

## 2019-06-23 DIAGNOSIS — E785 Hyperlipidemia, unspecified: Secondary | ICD-10-CM | POA: Diagnosis not present

## 2019-06-23 DIAGNOSIS — R0981 Nasal congestion: Secondary | ICD-10-CM | POA: Diagnosis not present

## 2019-06-23 DIAGNOSIS — I482 Chronic atrial fibrillation, unspecified: Secondary | ICD-10-CM | POA: Diagnosis not present

## 2019-06-23 DIAGNOSIS — I10 Essential (primary) hypertension: Secondary | ICD-10-CM | POA: Diagnosis not present

## 2019-06-23 DIAGNOSIS — G250 Essential tremor: Secondary | ICD-10-CM | POA: Diagnosis not present

## 2019-12-09 DIAGNOSIS — Z23 Encounter for immunization: Secondary | ICD-10-CM | POA: Diagnosis not present

## 2019-12-23 ENCOUNTER — Ambulatory Visit: Payer: Medicare Other | Admitting: Cardiology

## 2019-12-29 ENCOUNTER — Ambulatory Visit: Payer: Medicare Other | Admitting: Cardiology

## 2020-01-04 DIAGNOSIS — E1165 Type 2 diabetes mellitus with hyperglycemia: Secondary | ICD-10-CM | POA: Diagnosis not present

## 2020-01-04 DIAGNOSIS — Z125 Encounter for screening for malignant neoplasm of prostate: Secondary | ICD-10-CM | POA: Diagnosis not present

## 2020-01-04 DIAGNOSIS — I499 Cardiac arrhythmia, unspecified: Secondary | ICD-10-CM | POA: Diagnosis not present

## 2020-01-04 DIAGNOSIS — I482 Chronic atrial fibrillation, unspecified: Secondary | ICD-10-CM | POA: Diagnosis not present

## 2020-01-04 DIAGNOSIS — I10 Essential (primary) hypertension: Secondary | ICD-10-CM | POA: Diagnosis not present

## 2020-01-04 DIAGNOSIS — E785 Hyperlipidemia, unspecified: Secondary | ICD-10-CM | POA: Diagnosis not present

## 2020-01-06 DIAGNOSIS — Z23 Encounter for immunization: Secondary | ICD-10-CM | POA: Diagnosis not present

## 2020-01-10 DIAGNOSIS — I499 Cardiac arrhythmia, unspecified: Secondary | ICD-10-CM | POA: Diagnosis not present

## 2020-01-10 DIAGNOSIS — I482 Chronic atrial fibrillation, unspecified: Secondary | ICD-10-CM | POA: Diagnosis not present

## 2020-01-10 DIAGNOSIS — E785 Hyperlipidemia, unspecified: Secondary | ICD-10-CM | POA: Diagnosis not present

## 2020-01-10 DIAGNOSIS — R0981 Nasal congestion: Secondary | ICD-10-CM | POA: Diagnosis not present

## 2020-01-10 DIAGNOSIS — G250 Essential tremor: Secondary | ICD-10-CM | POA: Diagnosis not present

## 2020-01-10 DIAGNOSIS — I10 Essential (primary) hypertension: Secondary | ICD-10-CM | POA: Diagnosis not present

## 2020-01-14 ENCOUNTER — Emergency Department (HOSPITAL_COMMUNITY)
Admission: EM | Admit: 2020-01-14 | Discharge: 2020-01-14 | Disposition: A | Discharge status: Home / Self Care | Payer: Medicare Other | Attending: Emergency Medicine | Admitting: Emergency Medicine

## 2020-01-14 ENCOUNTER — Encounter (HOSPITAL_COMMUNITY): Payer: Self-pay | Admitting: Emergency Medicine

## 2020-01-14 ENCOUNTER — Other Ambulatory Visit: Payer: Self-pay

## 2020-01-14 DIAGNOSIS — C218 Malignant neoplasm of overlapping sites of rectum, anus and anal canal: Secondary | ICD-10-CM | POA: Diagnosis not present

## 2020-01-14 DIAGNOSIS — Z955 Presence of coronary angioplasty implant and graft: Secondary | ICD-10-CM | POA: Insufficient documentation

## 2020-01-14 DIAGNOSIS — Z7901 Long term (current) use of anticoagulants: Secondary | ICD-10-CM | POA: Diagnosis not present

## 2020-01-14 DIAGNOSIS — I10 Essential (primary) hypertension: Secondary | ICD-10-CM | POA: Insufficient documentation

## 2020-01-14 DIAGNOSIS — Z79899 Other long term (current) drug therapy: Secondary | ICD-10-CM | POA: Insufficient documentation

## 2020-01-14 DIAGNOSIS — Z87891 Personal history of nicotine dependence: Secondary | ICD-10-CM | POA: Insufficient documentation

## 2020-01-14 DIAGNOSIS — R339 Retention of urine, unspecified: Secondary | ICD-10-CM | POA: Diagnosis not present

## 2020-01-14 DIAGNOSIS — I2510 Atherosclerotic heart disease of native coronary artery without angina pectoris: Secondary | ICD-10-CM | POA: Insufficient documentation

## 2020-01-14 LAB — URINALYSIS, ROUTINE W REFLEX MICROSCOPIC
Bacteria, UA: NONE SEEN
Bilirubin Urine: NEGATIVE
Glucose, UA: NEGATIVE mg/dL
Ketones, ur: NEGATIVE mg/dL
Leukocytes,Ua: NEGATIVE
Nitrite: POSITIVE — AB
Protein, ur: NEGATIVE mg/dL
Specific Gravity, Urine: 1.009 (ref 1.005–1.030)
pH: 7 (ref 5.0–8.0)

## 2020-01-14 NOTE — ED Triage Notes (Signed)
Pt states since yesterday he hasn't been urinating very much. Feels like his abdomen is swollen and has urge to go to the bathroom but can't. No hx of difficulty with urination.

## 2020-01-14 NOTE — Discharge Instructions (Signed)
Please read the attached instructions Please come back to the hospital for severe or worsening symptoms If your catheter is causing severe pain, if you have fevers nausea vomiting or if the catheter is not draining urine come back to the hospital  Please see the phone number above to call the urologist in follow-up, he should be seen within 1 week  Do not take this catheter out yourself, it will cause trauma bleeding and damage to your internal organs

## 2020-01-14 NOTE — ED Provider Notes (Signed)
[facility] EMERGENCY DEPARTMENT Provider Note   CSN: [id number] Arrival date & time: 01/14/20  1159     History Chief Complaint  Patient presents with  . Dysuria    [name] is a 85 y.o. male.  HPI   This patient is an 85 year old male, history of coronary disease status post bypass grafting, atrial fibrillation, hypertension and rectal cancer status post resection with a diverting ostomy.  He presents to the hospital today with a complaint of some urinary symptoms, this started last night with a little bit of burning and noticing that he is not able to pass much urine, throughout the morning he has not been able to get much out, he feels like he needs to go, it is uncomfortable and only a few drops will come out.  He has been eating and drinking, he took Pyridium last night but this did not help although it did turn his urine a dark orange color.  Symptoms are persistent, severe, nothing seems to make it better or worse, no change in his ostomy output.  No f/c/n/v or CP / SOB.  Past Medical History:  Diagnosis Date  . CAD (coronary artery disease)    Status post CABG 1999  . Essential hypertension   . Hyperlipidemia   . Rectal cancer [facility])     Patient Active Problem List   Diagnosis Date Noted  . CAD, ARTERY BYPASS GRAFT 04/25/2009  . HYPERLIPIDEMIA 04/24/2009  . HYPERTENSION 04/24/2009    Past Surgical History:  Procedure Laterality Date  . CATARACT EXTRACTION W/PHACO Left 06/25/2015   Procedure: CATARACT EXTRACTION PHACO AND INTRAOCULAR LENS PLACEMENT; CDE:  4.69;  Surgeon: Williams Che, MD;  Location: [facility];  Service: Ophthalmology;  Laterality: Left;  . COLOSTOMY    . CORONARY ARTERY BYPASS GRAFT  1999  . RECTAL SURGERY         Family History  Problem Relation Age of Onset  . Alcoholism Father   . Diabetes Mellitus II Father     Social History   Tobacco Use  . Smoking status: Former Smoker    Types: Cigarettes  . Smokeless tobacco: Current  User    Types: Chew  Substance Use Topics  . Alcohol use: No  . Drug use: No    Home Medications Prior to Admission medications   Medication Sig Start Date End Date Taking? Authorizing Provider  amLODipine (NORVASC) 5 MG tablet Take 5 mg by mouth daily.      [provider]  atorvastatin (LIPITOR) 10 MG tablet Take 10 mg by mouth daily.      [provider]  ELIQUIS 5 MG TABS tablet Take 5 mg by mouth 2 (two) times daily. 05/02/19   [provider]  fish oil-omega-3 fatty acids 1000 MG capsule Take 1 capsule by mouth daily.      [provider]  folic acid (FOLVITE) 244 MCG tablet Take 400 mcg by mouth daily.    [provider]  Multiple Vitamin (MULTIVITAMIN) tablet Take 1 tablet by mouth daily.      [provider]  telmisartan (MICARDIS) 40 MG tablet Take 40 mg by mouth daily.      [provider]    Allergies    Patient has no known allergies.  Review of Systems   Review of Systems  All other systems reviewed and are negative.   Physical Exam Updated Vital Signs BP (!) 149/72   Pulse 95   Temp 98.7 F (37.1  C) (Oral)   Resp 18   Ht 1.727 m (5\' 8" )   Wt 60.3 kg   SpO2 97%   BMI 20.22 kg/m   Physical Exam Vitals and nursing note reviewed.  Constitutional:      General: He is not in acute distress.    Appearance: He is well-developed.  HENT:     Head: Normocephalic and atraumatic.     Mouth/Throat:     Pharynx: No oropharyngeal exudate.  Eyes:     General: No scleral icterus.       Right eye: No discharge.        Left eye: No discharge.     Conjunctiva/sclera: Conjunctivae normal.     Pupils: Pupils are equal, round, and reactive to light.  Neck:     Thyroid: No thyromegaly.     Vascular: No JVD.  Cardiovascular:     Rate and Rhythm: Normal rate and regular rhythm.     Heart sounds: Normal heart sounds. No murmur heard.  No friction rub. No gallop.   Pulmonary:     Effort: Pulmonary effort is  normal. No respiratory distress.     Breath sounds: Normal breath sounds. No wheezing or rales.  Abdominal:     General: Bowel sounds are normal. There is no distension.     Palpations: Abdomen is soft. There is no mass.     Tenderness: There is no abdominal tenderness.     Comments: Has L sided ostomy with associated hernia - reductible and non tender.  Mild SP ttp, no obvious masses and no peritoneal signs.  Genitourinary:    Comments: No d/c or bleeding at the urethral meatus.   Musculoskeletal:        General: No tenderness. Normal range of motion.     Cervical back: Normal range of motion and neck supple.  Lymphadenopathy:     Cervical: No cervical adenopathy.  Skin:    General: Skin is warm and dry.     Findings: No erythema or rash.  Neurological:     Mental Status: He is alert.     Coordination: Coordination normal.  Psychiatric:        Behavior: Behavior normal.     ED Results / Procedures / Treatments   Labs (all labs ordered are listed, but only abnormal results are displayed) Labs Reviewed  URINALYSIS, ROUTINE W REFLEX MICROSCOPIC - Abnormal; Notable for the following components:      Result Value   Color, Urine AMBER (*)    Hgb urine dipstick MODERATE (*)    Nitrite POSITIVE (*)    All other components within normal limits  URINE CULTURE    EKG None  Radiology No results found.  Procedures Procedures (including critical care time)  Medications Ordered in ED Medications - No data to display  ED Course  I have reviewed the triage vital signs and the nursing notes.  Pertinent labs & imaging results that were available during my care of the patient were reviewed by me and considered in my medical decision making (see chart for details).    MDM Rules/Calculators/A&P                          This patient does not have any acute abdominal findings - needs urinalysis - bladder scan to look for retention.  Considering that with hx of rectal cancer - and  20 years since treatment, with no prodromal sx - recurrence is less  likely.  The US showed > 900 cc of urine - catheter placed with complete resolution - he has no sx at this time, states all of his symptoms have gone away after catheter placement.  No obvious answer as to why he had urinary retention  Recommended f/u with PCP / Urology  UA clean - culture pending - no fever  Pt given instructions on Foley catheter care, agreeable and stable for discharge  Final Clinical Impression(s) / ED Diagnoses Final diagnoses:  Urinary retention    Rx / DC Orders ED Discharge Orders    None       Noemi Chapel, MD 01/14/20 1512

## 2020-01-16 LAB — URINE CULTURE: Culture: NO GROWTH

## 2020-01-27 ENCOUNTER — Encounter: Payer: Self-pay | Admitting: Urology

## 2020-01-27 ENCOUNTER — Other Ambulatory Visit: Payer: Self-pay

## 2020-01-27 ENCOUNTER — Ambulatory Visit (INDEPENDENT_AMBULATORY_CARE_PROVIDER_SITE_OTHER): Payer: Medicare Other | Admitting: Urology

## 2020-01-27 VITALS — BP 119/56 | HR 98 | Ht 68.0 in | Wt 133.0 lb

## 2020-01-27 DIAGNOSIS — R339 Retention of urine, unspecified: Secondary | ICD-10-CM

## 2020-01-27 DIAGNOSIS — I25119 Atherosclerotic heart disease of native coronary artery with unspecified angina pectoris: Secondary | ICD-10-CM

## 2020-01-27 DIAGNOSIS — N401 Enlarged prostate with lower urinary tract symptoms: Secondary | ICD-10-CM | POA: Diagnosis not present

## 2020-01-27 DIAGNOSIS — N138 Other obstructive and reflux uropathy: Secondary | ICD-10-CM | POA: Diagnosis not present

## 2020-01-27 MED ORDER — TAMSULOSIN HCL 0.4 MG PO CAPS
0.4000 mg | ORAL_CAPSULE | Freq: Every day | ORAL | 3 refills | Status: DC
Start: 2020-01-27 — End: 2020-02-29

## 2020-01-27 NOTE — Progress Notes (Signed)
Fill and Pull Catheter Removal  Patient is present today for a catheter removal.  Patient was cleaned and prepped in a sterile fashion 263ml of sterile water/ saline was instilled into the bladder when the patient felt the urge to urinate. 3ml of water was then drained from the balloon.  A 16FR foley cath was removed from the bladder no complications were noted .  Patient as then given some time to void on their own.  Patient can void  167ml on their own after some time.  Patient tolerated well.  Performed by: Darcee Dekker, LPN  Follow up/ Additional notes: Per MD note     Urological Symptom Review  Patient is experiencing the following symptoms: none   Review of Systems  Gastrointestinal (upper)  : Negative for upper GI symptoms  Gastrointestinal (lower) : Negative for lower GI symptoms  Constitutional : Negative for symptoms  Skin: Negative for skin symptoms  Eyes: Negative for eye symptoms  Ear/Nose/Throat : Sinus problems  Hematologic/Lymphatic: Negative for Hematologic/Lymphatic symptoms  Cardiovascular : Negative for cardiovascular symptoms  Respiratory : Negative for respiratory symptoms  Endocrine: Negative for endocrine symptoms  Musculoskeletal: Negative for musculoskeletal symptoms  Neurological: Negative for neurological symptoms  Psychologic: Negative for psychiatric symptoms

## 2020-01-27 NOTE — Progress Notes (Signed)
01/27/2020 10:48 AM   [name] 1934-07-03 [id number]  Referring provider: Celene Squibb, MD [address] [name],  [place] [id number]  Urinary retention  HPI: [name] is a 85yo here for evaluation of urinary retention. On 9/25 he presented to the ER with an inability to urinate. He drank a pepsi that day and then he developed severe urgency and dribbling. He normally does not drink soda and this was the firs ton in 4-5 years. He has a fair stream at baseline. Nocturia 2-3x, urinary frequency every 3 hours, no urgency, no dysuria. NO prior UTI. Urine culture from ER showed no growth. He was started on flomax in the ER and a foley catheter was placed. He passed his voiding trila today   PMH: Past Medical History:  Diagnosis Date  . CAD (coronary artery disease)    Status post CABG 1999  . Essential hypertension   . Hyperlipidemia   . Rectal cancer [facility])     Surgical History: Past Surgical History:  Procedure Laterality Date  . CATARACT EXTRACTION W/PHACO Left 06/25/2015   Procedure: CATARACT EXTRACTION PHACO AND INTRAOCULAR LENS PLACEMENT; CDE:  4.69;  Surgeon: Williams Che, MD;  Location: [facility];  Service: Ophthalmology;  Laterality: Left;  . COLOSTOMY    . CORONARY ARTERY BYPASS GRAFT  1999  . RECTAL SURGERY      Home Medications:  Allergies as of 01/27/2020   No Known Allergies     Medication List       Accurate as of January 27, 2020 10:48 AM. If you have any questions, ask your nurse or doctor.        ALPRAZolam 0.5 MG tablet Commonly known as: XANAX Take 0.5 mg by mouth 2 (two) times daily as needed.   amLODipine 5 MG tablet Commonly known as: NORVASC Take 5 mg by mouth daily.   atorvastatin 10 MG tablet Commonly known as: LIPITOR Take 10 mg by mouth daily.   Eliquis 5 MG Tabs tablet Generic drug: apixaban Take 5 mg by mouth 2 (two) times daily.   fish oil-omega-3 fatty acids 1000 MG capsule Take 1 capsule by mouth daily.   folic  acid 132 MCG tablet Commonly known as: FOLVITE Take 400 mcg by mouth daily.   losartan 50 MG tablet Commonly known as: COZAAR   multivitamin tablet Take 1 tablet by mouth daily.   tamsulosin 0.4 MG Caps capsule Commonly known as: FLOMAX Take 0.4 mg by mouth at bedtime.   telmisartan 40 MG tablet Commonly known as: MICARDIS Take 40 mg by mouth daily.       Allergies: No Known Allergies  Family History: Family History  Problem Relation Age of Onset  . Alcoholism Father   . Diabetes Mellitus II Father     Social History:  reports that he has never smoked. His smokeless tobacco use includes chew. He reports that he does not drink alcohol and does not use drugs.  ROS: All other review of systems were reviewed and are negative except what is noted above in HPI  Physical Exam: BP (!) 119/56   Pulse 98   Ht 5\' 8"  (1.727 m)   Wt 133 lb (60.3 kg)   BMI 20.22 kg/m   Constitutional:  Alert and oriented, No acute distress. HEENT: [place] AT, moist mucus membranes.  Trachea midline, no masses. Cardiovascular: No clubbing, cyanosis, or edema. Respiratory: Normal respiratory effort, no increased work of breathing. GI: Abdomen is soft, nontender, nondistended, no  abdominal masses GU: No CVA tenderness. Circumcised phallus. No masses/lesions on penis, testis, scrotum. Prostate 40g smooth no nodules no induration.  Lymph: No cervical or inguinal lymphadenopathy. Skin: No rashes, bruises or suspicious lesions. Neurologic: Grossly intact, no focal deficits, moving all 4 extremities. Psychiatric: Normal mood and affect.  Laboratory Data: Lab Results  Component Value Date   HGB 14.2 09/19/2008   HCT 40.7 09/19/2008    Lab Results  Component Value Date   CREATININE 1.01 09/19/2008    No results found for: PSA  No results found for: TESTOSTERONE  No results found for: HGBA1C  Urinalysis    Component Value Date/Time   COLORURINE AMBER (A) 01/14/2020 1309   APPEARANCEUR  CLEAR 01/14/2020 1309   LABSPEC 1.009 01/14/2020 1309   PHURINE 7.0 01/14/2020 1309   GLUCOSEU NEGATIVE 01/14/2020 1309   HGBUR MODERATE (A) 01/14/2020 1309   BILIRUBINUR NEGATIVE 01/14/2020 1309   KETONESUR NEGATIVE 01/14/2020 1309   PROTEINUR NEGATIVE 01/14/2020 1309   NITRITE POSITIVE (A) 01/14/2020 1309   LEUKOCYTESUR NEGATIVE 01/14/2020 1309    Lab Results  Component Value Date   BACTERIA NONE SEEN 01/14/2020    Pertinent Imaging:  No results found for this or any previous visit.  No results found for this or any previous visit.  No results found for this or any previous visit.  No results found for this or any previous visit.  No results found for this or any previous visit.  No results found for this or any previous visit.  No results found for this or any previous visit.  No results found for this or any previous visit.   Assessment & Plan:    1. BPH with LUTS, urinary retention -continue flomax 0.4mg  daily Return in about 4 weeks (around 02/24/2020) for PVR.  Nicolette Bang, MD  [facility] [place]

## 2020-01-27 NOTE — Patient Instructions (Signed)

## 2020-02-10 DIAGNOSIS — Z23 Encounter for immunization: Secondary | ICD-10-CM | POA: Diagnosis not present

## 2020-02-17 DIAGNOSIS — I482 Chronic atrial fibrillation, unspecified: Secondary | ICD-10-CM | POA: Diagnosis not present

## 2020-02-17 DIAGNOSIS — E7849 Other hyperlipidemia: Secondary | ICD-10-CM | POA: Diagnosis not present

## 2020-02-17 DIAGNOSIS — I10 Essential (primary) hypertension: Secondary | ICD-10-CM | POA: Diagnosis not present

## 2020-02-21 DIAGNOSIS — H5213 Myopia, bilateral: Secondary | ICD-10-CM | POA: Diagnosis not present

## 2020-02-21 DIAGNOSIS — H52203 Unspecified astigmatism, bilateral: Secondary | ICD-10-CM | POA: Diagnosis not present

## 2020-02-21 DIAGNOSIS — H43813 Vitreous degeneration, bilateral: Secondary | ICD-10-CM | POA: Diagnosis not present

## 2020-02-29 ENCOUNTER — Ambulatory Visit (INDEPENDENT_AMBULATORY_CARE_PROVIDER_SITE_OTHER): Payer: Medicare Other | Admitting: Urology

## 2020-02-29 ENCOUNTER — Other Ambulatory Visit: Payer: Self-pay

## 2020-02-29 ENCOUNTER — Encounter: Payer: Self-pay | Admitting: Urology

## 2020-02-29 VITALS — BP 102/61 | HR 86 | Temp 98.5°F | Ht 68.0 in | Wt 133.0 lb

## 2020-02-29 DIAGNOSIS — R339 Retention of urine, unspecified: Secondary | ICD-10-CM

## 2020-02-29 DIAGNOSIS — N138 Other obstructive and reflux uropathy: Secondary | ICD-10-CM | POA: Diagnosis not present

## 2020-02-29 DIAGNOSIS — N401 Enlarged prostate with lower urinary tract symptoms: Secondary | ICD-10-CM

## 2020-02-29 DIAGNOSIS — I25119 Atherosclerotic heart disease of native coronary artery with unspecified angina pectoris: Secondary | ICD-10-CM | POA: Diagnosis not present

## 2020-02-29 LAB — MICROSCOPIC EXAMINATION
Renal Epithel, UA: NONE SEEN /hpf
WBC, UA: >30 /hpf — AB (ref 0–5)

## 2020-02-29 LAB — URINALYSIS, ROUTINE W REFLEX MICROSCOPIC
Bilirubin, UA: NEGATIVE
Glucose, UA: NEGATIVE
Nitrite, UA: POSITIVE — AB
Specific Gravity, UA: 1.02 (ref 1.005–1.030)
Urobilinogen, Ur: 1 mg/dL (ref 0.2–1.0)
pH, UA: 5.5 (ref 5.0–7.5)

## 2020-02-29 LAB — BLADDER SCAN AMB NON-IMAGING: Scan Result: 27

## 2020-02-29 MED ORDER — TAMSULOSIN HCL 0.4 MG PO CAPS
0.4000 mg | ORAL_CAPSULE | Freq: Every day | ORAL | 3 refills | Status: DC
Start: 2020-02-29 — End: 2021-11-04

## 2020-02-29 NOTE — Progress Notes (Signed)
Urological Symptom Review  Patient is experiencing the following symptoms: Frequent urination Stream starts and stops Weak stream   Review of Systems  Gastrointestinal (upper)  : Negative for upper GI symptoms  Gastrointestinal (lower) : Negative for lower GI symptoms  Constitutional : Negative for symptoms  Skin: Negative for skin symptoms  Eyes: Blurred vision  Ear/Nose/Throat : Sinus problems   Hematologic/Lymphatic: Negative for Hematologic/Lymphatic symptoms  Cardiovascular : Negative for cardiovascular symptoms  Respiratory : Negative for respiratory symptoms  Endocrine: Negative for endocrine symptoms  Musculoskeletal: Joint pain  Neurological: Negative for neurological symptoms  Psychologic: Negative for psychiatric symptoms

## 2020-02-29 NOTE — Progress Notes (Signed)
02/29/2020 11:58 AM   [name] 22-Jan-1935 [id number]  Referring provider: Celene Squibb, MD [address] [name],  [place] [address]  Urinary retention  HPI: Mr Schnackenberg is a 85yo here for followup for BPH and urinary retention. Since last visit he has mild LUTS on flomax 0.4mg  daily. Stream strong. PVR 27cc. No dysuria or hematuria No hesitancy, dribbling, or straining to urinate. UA is concerning for infection.    PMH: Past Medical History:  Diagnosis Date   CAD (coronary artery disease)    Status post CABG 1999   Essential hypertension    Hyperlipidemia    Rectal cancer [facility])     Surgical History: Past Surgical History:  Procedure Laterality Date   CATARACT EXTRACTION W/PHACO Left 06/25/2015   Procedure: CATARACT EXTRACTION PHACO AND INTRAOCULAR LENS PLACEMENT; CDE:  4.69;  Surgeon: Williams Che, MD;  Location: [facility];  Service: Ophthalmology;  Laterality: Left;   COLOSTOMY     CORONARY ARTERY BYPASS GRAFT  1999   RECTAL SURGERY      Home Medications:  Allergies as of 02/29/2020   No Known Allergies     Medication List       Accurate as of February 29, 2020 11:58 AM. If you have any questions, ask your nurse or doctor.        ALPRAZolam 0.5 MG tablet Commonly known as: XANAX Take 0.5 mg by mouth 2 (two) times daily as needed.   amLODipine 5 MG tablet Commonly known as: NORVASC Take 5 mg by mouth daily.   atorvastatin 10 MG tablet Commonly known as: LIPITOR Take 10 mg by mouth daily.   Eliquis 5 MG Tabs tablet Generic drug: apixaban Take 5 mg by mouth 2 (two) times daily.   fish oil-omega-3 fatty acids 1000 MG capsule Take 1 capsule by mouth daily.   folic acid 295 MCG tablet Commonly known as: FOLVITE Take 400 mcg by mouth daily.   losartan 50 MG tablet Commonly known as: COZAAR   multivitamin tablet Take 1 tablet by mouth daily.   tamsulosin 0.4 MG Caps capsule Commonly known as: FLOMAX Take 1 capsule (0.4 mg  total) by mouth at bedtime.   telmisartan 40 MG tablet Commonly known as: MICARDIS Take 40 mg by mouth daily.       Allergies: No Known Allergies  Family History: Family History  Problem Relation Age of Onset   Alcoholism Father    Diabetes Mellitus II Father     Social History:  reports that he has never smoked. His smokeless tobacco use includes chew. He reports that he does not drink alcohol and does not use drugs.  ROS: All other review of systems were reviewed and are negative except what is noted above in HPI  Physical Exam: BP 102/61    Pulse 86    Temp 98.5 F (36.9 C)    Ht 5\' 8"  (1.727 m)    Wt 133 lb (60.3 kg)    BMI 20.22 kg/m   Constitutional:  Alert and oriented, No acute distress. HEENT: [place] AT, moist mucus membranes.  Trachea midline, no masses. Cardiovascular: No clubbing, cyanosis, or edema. Respiratory: Normal respiratory effort, no increased work of breathing. GI: Abdomen is soft, nontender, nondistended, no abdominal masses GU: No CVA tenderness.  Lymph: No cervical or inguinal lymphadenopathy. Skin: No rashes, bruises or suspicious lesions. Neurologic: Grossly intact, no focal deficits, moving all 4 extremities. Psychiatric: Normal mood and affect.  Laboratory Data: Lab Results  Component Value Date   HGB 14.2 09/19/2008   HCT 40.7 09/19/2008    Lab Results  Component Value Date   CREATININE 1.01 09/19/2008    No results found for: PSA  No results found for: TESTOSTERONE  No results found for: HGBA1C  Urinalysis    Component Value Date/Time   COLORURINE AMBER (A) 01/14/2020 1309   APPEARANCEUR CLEAR 01/14/2020 1309   LABSPEC 1.009 01/14/2020 1309   PHURINE 7.0 01/14/2020 1309   GLUCOSEU NEGATIVE 01/14/2020 1309   HGBUR MODERATE (A) 01/14/2020 1309   BILIRUBINUR NEGATIVE 01/14/2020 1309   KETONESUR NEGATIVE 01/14/2020 1309   PROTEINUR NEGATIVE 01/14/2020 1309   NITRITE POSITIVE (A) 01/14/2020 1309   LEUKOCYTESUR NEGATIVE  01/14/2020 1309    Lab Results  Component Value Date   BACTERIA NONE SEEN 01/14/2020    Pertinent Imaging:  No results found for this or any previous visit.  No results found for this or any previous visit.  No results found for this or any previous visit.  No results found for this or any previous visit.  No results found for this or any previous visit.  No results found for this or any previous visit.  No results found for this or any previous visit.  No results found for this or any previous visit.   Assessment & Plan:    1. Benign prostatic hyperplasia with urinary obstruction -Continue flomax 0.4mg  daily - Urinalysis, Routine w reflex microscopic - Bladder Scan (Post Void Residual) in office  2. Urinary retention Continue flomax 0.4mg  daily   No follow-ups on file.  Nicolette Bang, MD  [facility] [place]

## 2020-02-29 NOTE — Patient Instructions (Signed)

## 2020-03-05 LAB — URINE CULTURE

## 2020-03-06 ENCOUNTER — Telehealth: Payer: Self-pay

## 2020-03-06 ENCOUNTER — Other Ambulatory Visit: Payer: Self-pay

## 2020-03-06 DIAGNOSIS — I482 Chronic atrial fibrillation, unspecified: Secondary | ICD-10-CM | POA: Diagnosis not present

## 2020-03-06 DIAGNOSIS — I10 Essential (primary) hypertension: Secondary | ICD-10-CM | POA: Diagnosis not present

## 2020-03-06 DIAGNOSIS — E7849 Other hyperlipidemia: Secondary | ICD-10-CM | POA: Diagnosis not present

## 2020-03-06 MED ORDER — NITROFURANTOIN MONOHYD MACRO 100 MG PO CAPS: 100.0000 mg | ORAL_CAPSULE | Freq: Two times a day (BID) | ORAL | 0 refills | Status: DC

## 2020-03-06 NOTE — Telephone Encounter (Signed)
Pt called back and notified of abx by Estill Bamberg, RN

## 2020-03-06 NOTE — Progress Notes (Unsigned)
Lft msg. For pt. RX sent in.

## 2020-03-31 ENCOUNTER — Emergency Department (HOSPITAL_COMMUNITY)
Admission: EM | Admit: 2020-03-31 | Discharge: 2020-03-31 | Disposition: A | Discharge status: Home / Self Care | Payer: Medicare Other | Attending: Emergency Medicine | Admitting: Emergency Medicine

## 2020-03-31 ENCOUNTER — Other Ambulatory Visit: Payer: Self-pay

## 2020-03-31 ENCOUNTER — Encounter (HOSPITAL_COMMUNITY): Payer: Self-pay | Admitting: *Deleted

## 2020-03-31 DIAGNOSIS — I10 Essential (primary) hypertension: Secondary | ICD-10-CM | POA: Diagnosis not present

## 2020-03-31 DIAGNOSIS — R339 Retention of urine, unspecified: Secondary | ICD-10-CM

## 2020-03-31 DIAGNOSIS — Z85048 Personal history of other malignant neoplasm of rectum, rectosigmoid junction, and anus: Secondary | ICD-10-CM | POA: Diagnosis not present

## 2020-03-31 DIAGNOSIS — Z7901 Long term (current) use of anticoagulants: Secondary | ICD-10-CM | POA: Insufficient documentation

## 2020-03-31 DIAGNOSIS — I25810 Atherosclerosis of coronary artery bypass graft(s) without angina pectoris: Secondary | ICD-10-CM | POA: Diagnosis not present

## 2020-03-31 DIAGNOSIS — N390 Urinary tract infection, site not specified: Secondary | ICD-10-CM | POA: Diagnosis not present

## 2020-03-31 DIAGNOSIS — Z79899 Other long term (current) drug therapy: Secondary | ICD-10-CM | POA: Insufficient documentation

## 2020-03-31 LAB — URINALYSIS, ROUTINE W REFLEX MICROSCOPIC
Bilirubin Urine: NEGATIVE
Glucose, UA: NEGATIVE mg/dL
Ketones, ur: NEGATIVE mg/dL
Nitrite: POSITIVE — AB
Protein, ur: NEGATIVE mg/dL
Specific Gravity, Urine: 1.015 (ref 1.005–1.030)
WBC, UA: >50 WBC/hpf — ABNORMAL HIGH (ref 0–5)
pH: 5 (ref 5.0–8.0)

## 2020-03-31 MED ORDER — CEPHALEXIN 500 MG PO CAPS: 500.0000 mg | ORAL_CAPSULE | Freq: Two times a day (BID) | ORAL | 0 refills | Status: AC

## 2020-03-31 MED ORDER — CEPHALEXIN 500 MG PO CAPS: 500.0000 mg | ORAL_CAPSULE | Freq: Two times a day (BID) | ORAL | 0 refills | Status: DC

## 2020-03-31 MED ORDER — CEPHALEXIN 500 MG PO CAPS
500.0000 mg | ORAL_CAPSULE | Freq: Once | ORAL | Status: AC
  Administered 2020-03-31: 18:00:00 500 mg via ORAL
  Filled 2020-03-31: qty 1

## 2020-03-31 NOTE — ED Triage Notes (Signed)
Pt with urinary retention since last night, pt admits to some dribbling today.  Pt with a foley catheter placed in the recent past and had it for 13 days per pt.

## 2020-03-31 NOTE — ED Notes (Signed)
Pt in bed, pt states that he has only had small voids throughout the day, states that he has had a cath in the past, states that he feels like his bladder is full, bladder scan is 636, md at bedside, number 16 fr foley cath placed, aprox 600 ml of yellow urine out.

## 2020-03-31 NOTE — ED Notes (Signed)
Leg bag placed, pt states that he doesn't want the large bag to go home with, pt denies pain, pt states that he has had a foley before and has no questions about foley care, pt states that he is ready to go home, paper script given because pt uses mail order pharmacy.  Pt states that he is ready to go home, denies pain, pt verbalized understanding d/c instructions and follow up, pt from dpt

## 2020-03-31 NOTE — Discharge Instructions (Addendum)
You should follow-up with your urologist within the next week.  Please take cephalexin twice a day for the next 7 days, drink plenty of clear liquids and return to the emergency department for any severe or worsening symptoms including increasing pain abdominal pain nausea vomiting fevers or chills.

## 2020-03-31 NOTE — ED Provider Notes (Signed)
[facility] EMERGENCY DEPARTMENT Provider Note   CSN: [id number] Arrival date & time: 03/31/20  2423     History Chief Complaint  Patient presents with  . Urinary Retention    [name] is a 85 y.o. male.  HPI    85  Y/o male -history of coronary disease status post bypass grafting approximately 23 years ago, history of hypertension rectal cancer and unfortunately a history of urinary retention secondary to benign prostatic hypertrophy.  I saw the person approximately 3 months ago ago when he had urinary retention and required Foley catheter placement.  This was taken out and he is followed up with the urologist, was maintaining a strong stream at that time last seen about 1 month ago by urology and was doing well.  He has been taking his Flomax but notes that over the last several days he has had only some dribbling which is causing some burning, some lower abdominal discomfort.  He did have some chills but has not had any fevers.  No nausea or vomiting, this is a recurrent episode, he feels like he is retaining urine  Past Medical History:  Diagnosis Date  . CAD (coronary artery disease)    Status post CABG 1999  . Essential hypertension   . Hyperlipidemia   . Rectal cancer [facility])     Patient Active Problem List   Diagnosis Date Noted  . Benign prostatic hyperplasia with urinary obstruction 01/27/2020  . Urinary retention 01/27/2020  . CAD, ARTERY BYPASS GRAFT 04/25/2009  . HYPERLIPIDEMIA 04/24/2009  . HYPERTENSION 04/24/2009    Past Surgical History:  Procedure Laterality Date  . CATARACT EXTRACTION W/PHACO Left 06/25/2015   Procedure: CATARACT EXTRACTION PHACO AND INTRAOCULAR LENS PLACEMENT; CDE:  4.69;  Surgeon: Williams Che, MD;  Location: [facility];  Service: Ophthalmology;  Laterality: Left;  . COLOSTOMY    . CORONARY ARTERY BYPASS GRAFT  1999  . RECTAL SURGERY         Family History  Problem Relation Age of Onset  . Alcoholism Father   . Diabetes  Mellitus II Father     Social History   Tobacco Use  . Smoking status: Never Smoker  . Smokeless tobacco: Current User    Types: Chew  Substance Use Topics  . Alcohol use: No  . Drug use: No    Home Medications Prior to Admission medications   Medication Sig Start Date End Date Taking? Authorizing Provider  ALPRAZolam Duanne Moron) 0.5 MG tablet Take 0.5 mg by mouth 2 (two) times daily as needed. 01/10/20   [provider]  amLODipine (NORVASC) 5 MG tablet Take 5 mg by mouth daily.      [provider]  atorvastatin (LIPITOR) 10 MG tablet Take 10 mg by mouth daily.      [provider]  cephALEXin (KEFLEX) 500 MG capsule Take 1 capsule (500 mg total) by mouth 2 (two) times daily for 7 days. 03/31/20 04/07/20  Noemi Chapel, MD  ELIQUIS 5 MG TABS tablet Take 5 mg by mouth 2 (two) times daily. 05/02/19   [provider]  fish oil-omega-3 fatty acids 1000 MG capsule Take 1 capsule by mouth daily.      [provider]  folic acid (FOLVITE) 536 MCG tablet Take 400 mcg by mouth daily.    [provider]  losartan (COZAAR) 50 MG tablet  01/17/20   [provider]  Multiple Vitamin (MULTIVITAMIN) tablet Take 1 tablet by mouth daily.  [provider]  nitrofurantoin, macrocrystal-monohydrate, (MACROBID) 100 MG capsule Take 1 capsule (100 mg total) by mouth every 12 (twelve) hours. 03/06/20   McKenzie, Candee Furbish, MD  tamsulosin (FLOMAX) 0.4 MG CAPS capsule Take 1 capsule (0.4 mg total) by mouth at bedtime. 02/29/20   McKenzie, Candee Furbish, MD  telmisartan (MICARDIS) 40 MG tablet Take 40 mg by mouth daily.      [provider]    Allergies    Patient has no known allergies.  Review of Systems   Review of Systems  Constitutional: Positive for chills. Negative for fever.  HENT: Negative for sore throat.   Eyes: Negative for visual disturbance.  Respiratory: Negative for cough and shortness of breath.    Cardiovascular: Negative for chest pain.  Gastrointestinal: Positive for abdominal pain. Negative for diarrhea, nausea and vomiting.  Genitourinary: Positive for dysuria. Negative for frequency.  Musculoskeletal: Negative for back pain and neck pain.  Skin: Negative for rash.  Neurological: Negative for weakness, numbness and headaches.  Hematological: Negative for adenopathy.  Psychiatric/Behavioral: Negative for behavioral problems.    Physical Exam Updated Vital Signs BP 139/72 (BP Location: Left Arm)   Pulse 85   Temp 97.7 F (36.5 C) (Oral)   Resp 18   Ht 1.727 m (5\' 8" )   Wt 61.2 kg   SpO2 97%   BMI 20.53 kg/m   Physical Exam Vitals and nursing note reviewed.  Constitutional:      General: He is not in acute distress.    Appearance: He is well-developed and well-nourished.  HENT:     Head: Normocephalic and atraumatic.     Mouth/Throat:     Mouth: Oropharynx is clear and moist.     Pharynx: No oropharyngeal exudate.  Eyes:     General: No scleral icterus.       Right eye: No discharge.        Left eye: No discharge.     Extraocular Movements: EOM normal.     Conjunctiva/sclera: Conjunctivae normal.     Pupils: Pupils are equal, round, and reactive to light.  Neck:     Thyroid: No thyromegaly.     Vascular: No JVD.  Cardiovascular:     Rate and Rhythm: Normal rate and regular rhythm.     Pulses: Intact distal pulses.     Heart sounds: Normal heart sounds. No murmur heard. No friction rub. No gallop.   Pulmonary:     Effort: Pulmonary effort is normal. No respiratory distress.     Breath sounds: Normal breath sounds. No wheezing or rales.  Abdominal:     General: Bowel sounds are normal. There is no distension.     Palpations: Abdomen is soft. There is no mass.     Tenderness: There is no abdominal tenderness.     Comments: Colostomy bag present in the left lower abdomen, abdomen is very soft, there does appear to be a hernia in the left lower quadrant.   Genitourinary:    Comments: Normal-appearing penis scrotum and testicles, no blood or discharge at the urethral meatus Musculoskeletal:        General: No tenderness or edema. Normal range of motion.     Cervical back: Normal range of motion and neck supple.  Lymphadenopathy:     Cervical: No cervical adenopathy.  Skin:    General: Skin is warm and dry.     Findings: No erythema or rash.  Neurological:     Mental Status: He is alert.  Coordination: Coordination normal.  Psychiatric:        Mood and Affect: Mood and affect normal.        Behavior: Behavior normal.     ED Results / Procedures / Treatments   Labs (all labs ordered are listed, but only abnormal results are displayed) Labs Reviewed  URINALYSIS, ROUTINE W REFLEX MICROSCOPIC - Abnormal; Notable for the following components:      Result Value   APPearance CLOUDY (*)    Hgb urine dipstick SMALL (*)    Nitrite POSITIVE (*)    Leukocytes,Ua LARGE (*)    WBC, UA >50 (*)    Bacteria, UA MANY (*)    All other components within normal limits  URINE CULTURE    EKG None  Radiology No results found.  Procedures Procedures (including critical care time)  Medications Ordered in ED Medications  cephALEXin (KEFLEX) capsule 500 mg (500 mg Oral Given 03/31/20 1815)    ED Course  I have reviewed the triage vital signs and the nursing notes.  Pertinent labs & imaging results that were available during my care of the patient were reviewed by me and considered in my medical decision making (see chart for details).  Clinical Course as of 03/31/20 1829  Sat Mar 31, 2020  1658 The urinalysis shows greater than 50 white blood cells with many bacteria, positive for nitrites and leukocytes.  The patient will be treated for urinary tract infection, he is otherwise well-appearing without nausea vomiting fevers chills and has not [BM]    Clinical Course User Index [BM] Noemi Chapel, MD   MDM Rules/Calculators/A&P                           The patient's pulse is between 95 and 100, he does not appear to be in distress, he has no abdominal tenderness, the urine appears very cloudy, he will need a Foley catheter placed secondary to over 600 cc of urine in the bladder and the risk for worsening retention and potential kidney dysfunction.  He is not having fevers here, will check for UTI, culture the urine, anticipate discharge with a Foley catheter to follow-up with urology.  He will continue to take Flomax.  Foley placed, antibiotics given, patient stable for discharge with normal vital signs  Vitals:   03/31/20 1700 03/31/20 1814  BP: 128/72 139/72  Pulse: 78 85  Resp:  18  Temp:  97.7 F (36.5 C)  SpO2: 99% 97%     Final Clinical Impression(s) / ED Diagnoses Final diagnoses:  Urinary retention  Lower urinary tract infectious disease    Rx / DC Orders ED Discharge Orders         Ordered    cephALEXin (KEFLEX) 500 MG capsule  2 times daily,   Status:  Discontinued        03/31/20 1729    cephALEXin (KEFLEX) 500 MG capsule  2 times daily        03/31/20 1823           Noemi Chapel, MD 03/31/20 1829

## 2020-04-03 LAB — URINE CULTURE: Culture: >=100000 — AB

## 2020-04-06 ENCOUNTER — Other Ambulatory Visit: Payer: Self-pay

## 2020-04-06 DIAGNOSIS — R339 Retention of urine, unspecified: Secondary | ICD-10-CM

## 2020-04-06 NOTE — Progress Notes (Signed)
Pt called asking for voiding trial. Spoke with Dr. Alyson Ingles. Pt recently went back to the ER with urinary retention.   Dr. Alyson Ingles ordered urodynamics to be completed at [facility]. Pt notified and referral order placed.

## 2020-04-11 DIAGNOSIS — R338 Other retention of urine: Secondary | ICD-10-CM | POA: Diagnosis not present

## 2020-04-11 NOTE — ED Notes (Signed)
Pt called and reported has been missing insurance cards since visit on 03/31/20. Spoke with Pt RN, Registration, Security, ED lost and found. No insurance cards found. Pt aware unable to find insurance. Pt had additional questions regarding medication but did not wish to seek further action at this time. Pearla Dubonnet, Patient Experience Consultant notified just in case.

## 2020-04-30 ENCOUNTER — Encounter: Payer: Self-pay | Admitting: Urology

## 2020-04-30 ENCOUNTER — Other Ambulatory Visit: Payer: Self-pay

## 2020-04-30 ENCOUNTER — Ambulatory Visit (INDEPENDENT_AMBULATORY_CARE_PROVIDER_SITE_OTHER): Payer: Medicare Other | Admitting: Urology

## 2020-04-30 VITALS — BP 130/73 | HR 99 | Temp 97.9°F | Wt 130.0 lb

## 2020-04-30 DIAGNOSIS — N138 Other obstructive and reflux uropathy: Secondary | ICD-10-CM

## 2020-04-30 DIAGNOSIS — R339 Retention of urine, unspecified: Secondary | ICD-10-CM | POA: Diagnosis not present

## 2020-04-30 DIAGNOSIS — N401 Enlarged prostate with lower urinary tract symptoms: Secondary | ICD-10-CM

## 2020-04-30 MED ORDER — SILODOSIN 8 MG PO CAPS: 8.0000 mg | ORAL_CAPSULE | Freq: Every day | ORAL | 11 refills | Status: DC

## 2020-04-30 NOTE — Patient Instructions (Signed)

## 2020-04-30 NOTE — Progress Notes (Signed)
Urological Symptom Review  Patient is experiencing the following symptoms: Weak stream   Review of Systems  Gastrointestinal (upper)  : Negative for upper GI symptoms  Gastrointestinal (lower) : Negative for lower GI symptoms  Constitutional : Negative for symptoms  Skin: Negative for skin symptoms  Eyes: Blurred vision  Ear/Nose/Throat : Sinus problems  Hematologic/Lymphatic: Negative for Hematologic/Lymphatic symptoms  Cardiovascular : Negative for cardiovascular symptoms  Respiratory : Negative for respiratory symptoms  Endocrine: Negative for endocrine symptoms  Musculoskeletal: Negative for musculoskeletal symptoms  Neurological: Negative for neurological symptoms  Psychologic: Negative for psychiatric symptoms

## 2020-04-30 NOTE — Progress Notes (Signed)
04/30/2020 11:53 AM   [name] 1934-04-25 [id number]  Referring provider: Celene Squibb, MD [address] [name],  [place] [address]  followup urinary retention  HPI: [name] is a 86yo here for followup for urinary retention. He has failed multiple voiding trials. He is currently on flomax 0.4mg  daily. He underwent UDS which showed a good baldder capacity, weak detrusor contract of 27cm H2O, flow 5cc/sec and PVR 170cc.    PMH: Past Medical History:  Diagnosis Date  . CAD (coronary artery disease)    Status post CABG 1999  . Essential hypertension   . Hyperlipidemia   . Rectal cancer [facility])     Surgical History: Past Surgical History:  Procedure Laterality Date  . CATARACT EXTRACTION W/PHACO Left 06/25/2015   Procedure: CATARACT EXTRACTION PHACO AND INTRAOCULAR LENS PLACEMENT; CDE:  4.69;  Surgeon: Williams Che, MD;  Location: [facility];  Service: Ophthalmology;  Laterality: Left;  . COLOSTOMY    . CORONARY ARTERY BYPASS GRAFT  1999  . RECTAL SURGERY      Home Medications:  Allergies as of 04/30/2020   No Known Allergies     Medication List       Accurate as of April 30, 2020 11:53 AM. If you have any questions, ask your nurse or doctor.        STOP taking these medications   nitrofurantoin (macrocrystal-monohydrate) 100 MG capsule Commonly known as: MACROBID Stopped by: Nicolette Bang, MD     TAKE these medications   ALPRAZolam 0.5 MG tablet Commonly known as: XANAX Take 0.5 mg by mouth 2 (two) times daily as needed.   amLODipine 5 MG tablet Commonly known as: NORVASC Take 5 mg by mouth daily.   atorvastatin 10 MG tablet Commonly known as: LIPITOR Take 10 mg by mouth daily.   Eliquis 5 MG Tabs tablet Generic drug: apixaban Take 5 mg by mouth 2 (two) times daily.   fish oil-omega-3 fatty acids 1000 MG capsule Take 1 capsule by mouth daily.   folic acid 607 MCG tablet Commonly known as: FOLVITE Take 400 mcg by mouth daily.    losartan 50 MG tablet Commonly known as: COZAAR   multivitamin tablet Take 1 tablet by mouth daily.   silodosin 8 MG Caps capsule Commonly known as: RAPAFLO Take 1 capsule (8 mg total) by mouth at bedtime. Started by: Nicolette Bang, MD   tamsulosin 0.4 MG Caps capsule Commonly known as: FLOMAX Take 1 capsule (0.4 mg total) by mouth at bedtime.   telmisartan 40 MG tablet Commonly known as: MICARDIS Take 40 mg by mouth daily.       Allergies: No Known Allergies  Family History: Family History  Problem Relation Age of Onset  . Alcoholism Father   . Diabetes Mellitus II Father     Social History:  reports that he has never smoked. His smokeless tobacco use includes chew. He reports that he does not drink alcohol and does not use drugs.  ROS: All other review of systems were reviewed and are negative except what is noted above in HPI  Physical Exam: BP 130/73   Pulse 99   Temp 97.9 F (36.6 C)   Wt 130 lb (59 kg)   BMI 19.77 kg/m   Constitutional:  Alert and oriented, No acute distress. HEENT: [place] AT, moist mucus membranes.  Trachea midline, no masses. Cardiovascular: No clubbing, cyanosis, or edema. Respiratory: Normal respiratory effort, no increased work of breathing. GI: Abdomen is soft,  nontender, nondistended, no abdominal masses GU: No CVA tenderness.  Lymph: No cervical or inguinal lymphadenopathy. Skin: No rashes, bruises or suspicious lesions. Neurologic: Grossly intact, no focal deficits, moving all 4 extremities. Psychiatric: Normal mood and affect.  Laboratory Data: Lab Results  Component Value Date   HGB 14.2 09/19/2008   HCT 40.7 09/19/2008    Lab Results  Component Value Date   CREATININE 1.01 09/19/2008    No results found for: PSA  No results found for: TESTOSTERONE  No results found for: HGBA1C  Urinalysis    Component Value Date/Time   COLORURINE YELLOW 03/31/2020 1614   APPEARANCEUR CLOUDY (A) 03/31/2020 1614    APPEARANCEUR Cloudy (A) 02/29/2020 1116   LABSPEC 1.015 03/31/2020 1614   PHURINE 5.0 03/31/2020 1614   GLUCOSEU NEGATIVE 03/31/2020 1614   HGBUR SMALL (A) 03/31/2020 [place] 03/31/2020 1614   BILIRUBINUR Negative 02/29/2020 1116   KETONESUR NEGATIVE 03/31/2020 1614   PROTEINUR NEGATIVE 03/31/2020 1614   NITRITE POSITIVE (A) 03/31/2020 1614   LEUKOCYTESUR LARGE (A) 03/31/2020 1614    Lab Results  Component Value Date   LABMICR See below: 02/29/2020   WBCUA >30 (A) 02/29/2020   LABEPIT 0-10 02/29/2020   BACTERIA MANY (A) 03/31/2020    Pertinent Imaging: UDS: Results reviewed and discussed with the patient No results found for this or any previous visit.  No results found for this or any previous visit.  No results found for this or any previous visit.  No results found for this or any previous visit.  No results found for this or any previous visit.  No results found for this or any previous visit.  No results found for this or any previous visit.  No results found for this or any previous visit.   Assessment & Plan:    1. Urinary retention -we will trial rapaflo 8mg  qhs  2. Benign prostatic hyperplasia with urinary obstruction -rapaflo 8mg    No follow-ups on file.  Nicolette Bang, MD  [facility] [place]

## 2020-05-08 ENCOUNTER — Other Ambulatory Visit: Payer: Self-pay | Admitting: Urology

## 2020-05-14 ENCOUNTER — Ambulatory Visit: Payer: Medicare Other

## 2020-05-18 ENCOUNTER — Ambulatory Visit (INDEPENDENT_AMBULATORY_CARE_PROVIDER_SITE_OTHER): Payer: Medicare Other

## 2020-05-18 ENCOUNTER — Other Ambulatory Visit: Payer: Self-pay

## 2020-05-18 DIAGNOSIS — R339 Retention of urine, unspecified: Secondary | ICD-10-CM | POA: Diagnosis not present

## 2020-05-18 NOTE — Progress Notes (Signed)
Uroflow  Peak Flow: 42ml Average Flow: 20ml Voided Volume: 177ml Voiding Time: 36sec Flow Time:  36sec Time to Peak Flow: 11sec    Fill and Pull Catheter Removal  Patient is present today for a catheter removal.  Patient was cleaned and prepped in a sterile fashion 161ml of sterile water/ saline was instilled into the bladder when the patient felt the urge to urinate. 48ml of water was then drained from the balloon.  A 16FR foley cath was removed from the bladder no complications were noted .  Patient as then given some time to void on their own.  Patient can void  128ml on their own after some time.  Patient tolerated well.  Performed by: Estill Bamberg RN  Follow up/ Additional notes: 1 month office visit PVR

## 2020-05-22 ENCOUNTER — Ambulatory Visit (INDEPENDENT_AMBULATORY_CARE_PROVIDER_SITE_OTHER): Payer: Medicare Other

## 2020-05-22 ENCOUNTER — Telehealth: Payer: Self-pay

## 2020-05-22 ENCOUNTER — Other Ambulatory Visit: Payer: Self-pay

## 2020-05-22 DIAGNOSIS — R339 Retention of urine, unspecified: Secondary | ICD-10-CM | POA: Diagnosis not present

## 2020-05-22 LAB — MICROSCOPIC EXAMINATION
Renal Epithel, UA: NONE SEEN /hpf
WBC, UA: >30 /hpf — AB (ref 0–5)

## 2020-05-22 LAB — URINALYSIS, ROUTINE W REFLEX MICROSCOPIC
Bilirubin, UA: NEGATIVE
Glucose, UA: NEGATIVE
Nitrite, UA: POSITIVE — AB
Specific Gravity, UA: 1.015 (ref 1.005–1.030)
Urobilinogen, Ur: 1 mg/dL (ref 0.2–1.0)
pH, UA: 6 (ref 5.0–7.5)

## 2020-05-22 MED ORDER — SULFAMETHOXAZOLE-TRIMETHOPRIM 800-160 MG PO TABS: 1.0000 | ORAL_TABLET | Freq: Two times a day (BID) | ORAL | 0 refills | Status: DC

## 2020-05-22 NOTE — Telephone Encounter (Signed)
Pt wanted an antibiotic because he knew he had an infection. We did a ua and he did have an infection with symptoms. Dr. Diona Fanti sent in a prescription for pt to start on.

## 2020-05-23 ENCOUNTER — Telehealth: Payer: Self-pay

## 2020-05-23 NOTE — Telephone Encounter (Signed)
Attempted to call pt back from message left earlier. Got busy signal at first. Then next attempt it just rang. No way to leave message.

## 2020-05-24 ENCOUNTER — Other Ambulatory Visit: Payer: Self-pay

## 2020-05-24 ENCOUNTER — Ambulatory Visit (INDEPENDENT_AMBULATORY_CARE_PROVIDER_SITE_OTHER): Payer: Medicare Other

## 2020-05-24 DIAGNOSIS — R339 Retention of urine, unspecified: Secondary | ICD-10-CM | POA: Diagnosis not present

## 2020-05-24 LAB — MICROSCOPIC EXAMINATION
Epithelial Cells (non renal): NONE SEEN /hpf (ref 0–10)
Renal Epithel, UA: NONE SEEN /hpf

## 2020-05-24 LAB — URINALYSIS, ROUTINE W REFLEX MICROSCOPIC
Bilirubin, UA: NEGATIVE
Glucose, UA: NEGATIVE
Ketones, UA: NEGATIVE
Nitrite, UA: NEGATIVE
Protein,UA: NEGATIVE
Specific Gravity, UA: 1.01 (ref 1.005–1.030)
Urobilinogen, Ur: 0.2 mg/dL (ref 0.2–1.0)
pH, UA: 5.5 (ref 5.0–7.5)

## 2020-05-24 NOTE — Progress Notes (Signed)
Bladder Scan Patient cannot void: 846 ml Performed By: Demontre Padin,lpn  Simple Catheter Placement  Due to urinary retention patient is present today for a foley cath placement.  Patient was cleaned and prepped in a sterile fashion with betadine. A 16 FR foley catheter was inserted, urine return was noted  812ml, urine was yellow in color.  The balloon was filled with 10cc of sterile water.  A leg bag was attached for drainage.  Patient was given instruction on proper catheter care.  Patient tolerated well, no complications were noted   Performed by: Rajendra Spiller, lpn  Additional notes/ Follow up: Keep next scheduled OV

## 2020-05-25 ENCOUNTER — Ambulatory Visit: Payer: Medicare Other

## 2020-05-25 LAB — CULTURE, URINE COMPREHENSIVE

## 2020-05-28 ENCOUNTER — Other Ambulatory Visit: Payer: Self-pay

## 2020-05-28 ENCOUNTER — Telehealth: Payer: Self-pay

## 2020-05-28 DIAGNOSIS — R339 Retention of urine, unspecified: Secondary | ICD-10-CM

## 2020-05-28 LAB — URINE CULTURE

## 2020-05-28 MED ORDER — CIPROFLOXACIN HCL 250 MG PO TABS: 250.0000 mg | ORAL_TABLET | Freq: Two times a day (BID) | ORAL | 0 refills | Status: AC

## 2020-05-28 NOTE — Telephone Encounter (Signed)
-----   Message from Cleon Gustin, MD sent at 05/28/2020  9:46 AM EST ----- Cipro 250mg  BID for 7 dyas ----- Message ----- From: Iris Pert, LPN Sent: 08/24/3746   2:52 PM EST To: Cleon Gustin, MD  Put on Bactrim DS 05/22/20. Please review.

## 2020-05-28 NOTE — Telephone Encounter (Signed)
Rx sent in Message left to return call to office

## 2020-05-29 NOTE — Telephone Encounter (Signed)
Patient returned call and made aware of new antibiotic. Patient states that he had already picked it up and started taking it.

## 2020-06-11 ENCOUNTER — Other Ambulatory Visit: Payer: Self-pay

## 2020-06-11 ENCOUNTER — Ambulatory Visit: Payer: Medicare Other

## 2020-06-11 DIAGNOSIS — R339 Retention of urine, unspecified: Secondary | ICD-10-CM

## 2020-06-11 NOTE — Progress Notes (Signed)
Pt came in to get another leg bag. He said it was leaking at bottom where you empty the bag. The leg bag was pulled down tight over his knee pulling on the catheter. I changed his leg bag and explained he needed to keep the bag up higher on his leg. I put another cath holder on his cath also.

## 2020-06-22 ENCOUNTER — Ambulatory Visit (INDEPENDENT_AMBULATORY_CARE_PROVIDER_SITE_OTHER): Payer: Medicare Other | Admitting: Urology

## 2020-06-22 ENCOUNTER — Other Ambulatory Visit: Payer: Self-pay

## 2020-06-22 ENCOUNTER — Encounter: Payer: Self-pay | Admitting: Urology

## 2020-06-22 VITALS — BP 108/52 | HR 75 | Temp 98.4°F | Ht 68.0 in | Wt 130.0 lb

## 2020-06-22 DIAGNOSIS — N401 Enlarged prostate with lower urinary tract symptoms: Secondary | ICD-10-CM

## 2020-06-22 DIAGNOSIS — N138 Other obstructive and reflux uropathy: Secondary | ICD-10-CM | POA: Diagnosis not present

## 2020-06-22 MED ORDER — CIPROFLOXACIN HCL 500 MG PO TABS
500.0000 mg | ORAL_TABLET | Freq: Once | ORAL | Status: AC
  Administered 2020-06-22: 500 mg via ORAL

## 2020-06-22 NOTE — Progress Notes (Addendum)
Urological Symptom Review  Patient is experiencing the following symptoms: none   Review of Systems  Gastrointestinal (upper)  : Negative for upper GI symptoms  Gastrointestinal (lower) : Negative for lower GI symptoms  Constitutional : Negative for symptoms  Skin: Negative for skin symptoms  Eyes: Negative for eye symptoms  Ear/Nose/Throat : Negative for Ear/Nose/Throat symptoms  Hematologic/Lymphatic: Negative for Hematologic/Lymphatic symptoms  Cardiovascular : Negative for cardiovascular symptoms  Respiratory : Negative for respiratory symptoms  Endocrine: Negative for endocrine symptoms  Musculoskeletal: Negative for musculoskeletal symptoms  Neurological: Negative for neurological symptoms  Psychologic: Negative for psychiatric symptoms    Cath Change/ Replacement  Patient is present today for a catheter change due to urinary retention.  24ml of water was removed from the balloon, a 16FR foley cath was removed without difficulty.  Patient was cleaned and prepped in a sterile fashion with betadine. A 16 FR foley cath was replaced into the bladder no complications were noted Urine return was noted and urine was yellow in color. The balloon was filled with 32ml of sterile water. A leg bag was attached for drainage.   Patient was given proper instruction on catheter care.    Performed by: Melainie Krinsky, lpn  Follow up: Keep next scheduled NV

## 2020-06-22 NOTE — Progress Notes (Signed)
Clearance sent to Dr. Wende Neighbors to hold eliquis prior to urolift.

## 2020-06-22 NOTE — Progress Notes (Signed)
   06/22/20  CC: urinary retention  HPI: [name] is a 86yo here for followup for BPH and urinary retention.  Blood pressure (!) 108/52, pulse 75, temperature 98.4 F (36.9 C), height 5\' 8"  (1.727 m), weight 130 lb (59 kg). NED. A&Ox3.   No respiratory distress   Abd soft, NT, ND Normal phallus with bilateral descended testicles  Cystoscopy Procedure Note  Patient identification was confirmed, informed consent was obtained, and patient was prepped using Betadine solution.  Lidocaine jelly was administered per urethral meatus.     Pre-Procedure: - Inspection reveals a normal caliber ureteral meatus.  Procedure: The flexible cystoscope was introduced without difficulty - No urethral strictures/lesions are present. - Enlarged prostate no median lobe - Normal bladder neck - Bilateral ureteral orifices identified - Bladder mucosa  reveals no ulcers, tumors, or lesions - No bladder stones - No trabeculation  Retroflexion shows no intravesical prostatic protrusion   Post-Procedure: - Patient tolerated the procedure well  Assessment/ Plan: We discussed the management of his BPH including continued medical therapy, Rezum, Urolift, TURP and simple prostatectomy. After discussing the options the patient has elected to proceed with Urolift. Risks/benefits/alternatives discussed.   No follow-ups on file.  Nicolette Bang, MD

## 2020-06-22 NOTE — Patient Instructions (Signed)

## 2020-07-18 DIAGNOSIS — E7849 Other hyperlipidemia: Secondary | ICD-10-CM | POA: Diagnosis not present

## 2020-07-18 DIAGNOSIS — I482 Chronic atrial fibrillation, unspecified: Secondary | ICD-10-CM | POA: Diagnosis not present

## 2020-07-18 DIAGNOSIS — I10 Essential (primary) hypertension: Secondary | ICD-10-CM | POA: Diagnosis not present

## 2020-07-23 ENCOUNTER — Ambulatory Visit (INDEPENDENT_AMBULATORY_CARE_PROVIDER_SITE_OTHER): Payer: Medicare Other

## 2020-07-23 ENCOUNTER — Other Ambulatory Visit: Payer: Self-pay

## 2020-07-23 DIAGNOSIS — R339 Retention of urine, unspecified: Secondary | ICD-10-CM

## 2020-07-23 NOTE — Patient Instructions (Signed)

## 2020-07-23 NOTE — Progress Notes (Signed)
Cath Change/ Replacement  Patient is present today for a catheter change due to urinary retention.  60ml of water was removed from the balloon, a 16FR foley cath was removed with out difficulty.  Patient was cleaned and prepped in a sterile fashion with betadine. A 16 FR foley cath was replaced into the bladder no complications were noted Urine return was noted 92ml and urine was yellow in color. The balloon was filled with 16ml of sterile water. A leg bag was attached for drainage.  A night bag was also given to the patient and patient was given instruction on how to change from one bag to another. Patient was given proper instruction on catheter care.    Performed by: Estill Bamberg RN  Follow up: keep scheduled appointment for surgery April 21st

## 2020-07-31 ENCOUNTER — Telehealth: Payer: Self-pay | Admitting: Cardiology

## 2020-07-31 NOTE — Telephone Encounter (Signed)
   [facility] Pre-operative Risk Assessment    HEARTCARE STAFF: - Please ensure there is not already an duplicate clearance open for this procedure. - Under Visit Info/Reason for Call, type in Other and utilize the format Clearance MM/DD/YY or Clearance TBD. Do not use dashes or single digits. - If request is for dental extraction, please clarify the # of teeth to be extracted.  Request for surgical clearance:  1. What type of surgery is being performed? Cystoscopy with Urolift  2. When is this surgery scheduled? August 09, 2020  3. What type of clearance is required (medical clearance vs. Pharmacy clearance to hold med vs. Both)? Both   4. Are there any medications that need to be held prior to surgery and how long? 3 days  5. Practice name and name of physician performing surgery? Dr. Nicolette Bang - [facility]  6. What is the office phone number? [phone]   7.   What is the office fax number? [phone]  8.   Anesthesia type (None, local, MAC, general) ? General   Nelda Marseille 07/31/2020, 2:40 PM  _________________________________________________________________   (provider comments below)

## 2020-08-01 ENCOUNTER — Telehealth: Payer: Self-pay | Admitting: Cardiology

## 2020-08-01 NOTE — Telephone Encounter (Signed)
I have reviewed schedules for [place] location for pre op appt 4/15 1 pm, however the pt has pre op testing covid test @ 1:15. I will see if the [place] office may help in finding an appt where we may be able to squeeze the pt in.

## 2020-08-01 NOTE — Telephone Encounter (Signed)
Patient with diagnosis of afib on Eliquis for anticoagulation.    Procedure: Cystoscopy with Urolift Date of procedure: 08/09/20  CHA2DS2-VASc Score = 4  This indicates a 4.8% annual risk of stroke. The patient's score is based upon: CHF History: No HTN History: Yes Diabetes History: No Stroke History: No Vascular Disease History: Yes Age Score: 2 Gender Score: 0     CrCl 37.6 ml/min Platelet count 154  Per office protocol, patient can hold Eliquis for 3 days prior to procedure.

## 2020-08-01 NOTE — Telephone Encounter (Signed)
New message    Patient wife called neurology thinks that patient keeps passing out because of his blood pressure and they want to have a tilt test done?  Please advise

## 2020-08-01 NOTE — Telephone Encounter (Signed)
Per our scheduling team the pt has refused the appt with Levell July, NP for pre op appt. Pt has been placed on a wait list for an appt as there are no other available appts at this time. I will let the requesting office know pt no longer as an appt at this for pre op clearance.

## 2020-08-01 NOTE — Telephone Encounter (Signed)
Pt has been scheduled to Levell July, NP for pre op appt. Will send notes to NP for upcoming appt.

## 2020-08-01 NOTE — Telephone Encounter (Signed)
   Name: [name]  DOB: 22-Nov-1934  MRN: [id number]  Primary Cardiologist: Rozann Lesches, MD  Chart reviewed as part of pre-operative protocol coverage. Because of [name]'s past medical history and time since last visit, he will require a follow-up visit in order to better assess preoperative cardiovascular risk.  Pre-op covering staff: - Please schedule appointment and call patient to inform them. If patient already had an upcoming appointment within acceptable timeframe, please add "pre-op clearance" to the appointment notes so provider is aware. - Please contact requesting surgeon's office via preferred method (i.e, phone, fax) to inform them of need for appointment prior to surgery.  If applicable, this message will also be routed to pharmacy pool and/or primary cardiologist for input on holding anticoagulant/antiplatelet agent as requested below so that this information is available to the clearing provider at time of patient's appointment.   [place], PA  08/01/2020, 11:20 AM

## 2020-08-01 NOTE — Telephone Encounter (Signed)
Both patient and wife deny calling our office. Patient states that he did not pass out, he fell due to having hardwood floors in the house and wearing socks. Patient denies having a neurologist and only having a urologist.

## 2020-08-02 NOTE — Patient Instructions (Signed)
[name]  08/02/2020     @PREFPERIOPPHARMACY @   Your procedure is scheduled on  08/09/2020   [facility] to [facility] at  [facility]   Call this number if you have problems the morning of surgery:  [phone]   Remember:  Do not eat or drink after midnight.                        Take these medicines the morning of surgery with A SIP OF WATER  Xanax (if needed), amlodipine.    Place clean sheets on your bed the night before your procedure and DO NOT sleep with pets this night.  Shower with CHG the night before and the morning of your procedure, DO NOT use CHG on your face, hair or genitals.  After each shower, dry off with a clean towel, put on clean, comfortable clothes and brush your teeth.      Do not wear jewelry, make-up or nail polish.  Do not wear lotions, powders, or perfumes, or deodorant.  Do not shave 48 hours prior to surgery.  Men may shave face and neck.  Do not bring valuables to the hospital.  [facility] is not responsible for any belongings or valuables.   Contacts, dentures or bridgework may not be worn into surgery.  Leave your suitcase in the car.  After surgery it may be brought to your room.  For patients admitted to the hospital, discharge time will be determined by your treatment team.  Patients discharged the day of surgery will not be allowed to drive home and must have someone with them for 24 hours.   Special instructions:  DO NOT smoke tobacco or vape for 24 hours before your procedure.   Please read over the following fact sheets that you were given. Coughing and Deep Breathing, Surgical Site Infection Prevention, Anesthesia Post-op Instructions and Care and Recovery After Surgery       Cystoscopy Cystoscopy is a procedure that is used to help diagnose and sometimes treat conditions that affect the lower urinary tract. The lower urinary tract includes the bladder and the urethra. The urethra is the tube that drains  urine from the bladder. Cystoscopy is done using a thin, tube-shaped instrument with a light and camera at the end (cystoscope). The cystoscope may be hard or flexible, depending on the goal of the procedure. The cystoscope is inserted through the urethra, into the bladder. Cystoscopy may be recommended if you have:  Urinary tract infections that keep coming back.  Blood in the urine (hematuria).  An inability to control when you urinate (urinary incontinence) or an overactive bladder.  Unusual cells found in a urine sample.  A blockage in the urethra, such as a urinary stone.  Painful urination.  An abnormality in the bladder found during an intravenous pyelogram (IVP) or CT scan. Cystoscopy may also be done to remove a sample of tissue to be examined under a microscope (biopsy). Tell a health care provider about:  Any allergies you have.  All medicines you are taking, including vitamins, herbs, eye drops, creams, and over-the-counter medicines.  Any problems you or family members have had with anesthetic medicines.  Any blood disorders you have.  Any surgeries you have had.  Any medical conditions you have.  Whether you are pregnant or may be pregnant. What are the risks? Generally, this is a safe procedure. However, problems may occur, including:  Infection.  Bleeding.  Allergic reactions to medicines.  Damage to other structures or organs. What happens before the procedure? Medicines Ask your health care provider about:  Changing or stopping your regular medicines. This is especially important if you are taking diabetes medicines or blood thinners.  Taking medicines such as aspirin and ibuprofen. These medicines can thin your blood. Do not take these medicines unless your health care provider tells you to take them.  Taking over-the-counter medicines, vitamins, herbs, and supplements. Tests You may have an exam or testing, such as:  X-rays of the bladder,  urethra, or kidneys.  CT scan of the abdomen or pelvis.  Urine tests to check for signs of infection. General instructions  Follow instructions from your health care provider about eating or drinking restrictions.  Ask your health care provider what steps will be taken to help prevent infection. These steps may include: ? Washing skin with a germ-killing soap. ? Taking antibiotic medicine.  Plan to have a responsible adult take you home from the hospital or clinic. What happens during the procedure?  You will be given one or more of the following: ? A medicine to help you relax (sedative). ? A medicine to numb the area (local anesthetic).  The area around the opening of your urethra will be cleaned.  The cystoscope will be passed through your urethra into your bladder.  Germ-free (sterile) fluid will flow through the cystoscope to fill your bladder. The fluid will stretch your bladder so that your health care provider can clearly examine your bladder walls.  Your doctor will look at the urethra and bladder. Your doctor may take a biopsy or remove stones.  The cystoscope will be removed, and your bladder will be emptied. The procedure may vary among health care providers and hospitals.   What can I expect after the procedure? After the procedure, it is common to have:  Some soreness or pain in your abdomen and urethra.  Urinary symptoms. These include: ? Mild pain or burning when you urinate. Pain should stop within a few minutes after you urinate. This may last for up to 1 week. ? A small amount of blood in your urine for several days. ? Feeling like you need to urinate but producing only a small amount of urine. Follow these instructions at home: Medicines  Take over-the-counter and prescription medicines only as told by your health care provider.  If you were prescribed an antibiotic medicine, take it as told by your health care provider. Do not stop taking the  antibiotic even if you start to feel better. General instructions  Return to your normal activities as told by your health care provider. Ask your health care provider what activities are safe for you.  If you were given a sedative during the procedure, it can affect you for several hours. Do not drive or operate machinery until your health care provider says that it is safe.  Watch for any blood in your urine. If the amount of blood in your urine increases, call your health care provider.  Follow instructions from your health care provider about eating or drinking restrictions.  If a tissue sample was removed for testing (biopsy) during your procedure, it is up to you to get your test results. Ask your health care provider, or the department that is doing the test, when your results will be ready.  Drink enough fluid to keep your urine pale yellow.  Keep all follow-up visits. This is important. Contact  a health care provider if:  You have pain that gets worse or does not get better with medicine, especially pain when you urinate.  You have trouble urinating.  You have more blood in your urine. Get help right away if:  You have blood clots in your urine.  You have abdominal pain.  You have a fever or chills.  You are unable to urinate. Summary  Cystoscopy is a procedure that is used to help diagnose and sometimes treat conditions that affect the lower urinary tract.  Cystoscopy is done using a thin, tube-shaped instrument with a light and camera at the end.  After the procedure, it is common to have some soreness or pain in your abdomen and urethra.  Watch for any blood in your urine. If the amount of blood in your urine increases, call your health care provider.  If you were prescribed an antibiotic medicine, take it as told by your health care provider. Do not stop taking the antibiotic even if you start to feel better. This information is not intended to replace advice  given to you by your health care provider. Make sure you discuss any questions you have with your health care provider. Document Revised: 11/18/2019 Document Reviewed: 11/18/2019 Elsevier Patient Education  2021 [place] Anesthesia, Adult, Care After This sheet gives you information about how to care for yourself after your procedure. Your health care provider may also give you more specific instructions. If you have problems or questions, contact your health care provider. What can I expect after the procedure? After the procedure, the following side effects are common:  Pain or discomfort at the IV site.  Nausea.  Vomiting.  Sore throat.  Trouble concentrating.  Feeling cold or chills.  Feeling weak or tired.  Sleepiness and fatigue.  Soreness and body aches. These side effects can affect parts of the body that were not involved in surgery. Follow these instructions at home: For the time period you were told by your health care provider:  Rest.  Do not participate in activities where you could fall or become injured.  Do not drive or use machinery.  Do not drink alcohol.  Do not take sleeping pills or medicines that cause drowsiness.  Do not make important decisions or sign legal documents.  Do not take care of children on your own.   Eating and drinking  Follow any instructions from your health care provider about eating or drinking restrictions.  When you feel hungry, start by eating small amounts of foods that are soft and easy to digest (bland), such as toast. Gradually return to your regular diet.  Drink enough fluid to keep your urine pale yellow.  If you vomit, rehydrate by drinking water, juice, or clear broth. General instructions  If you have sleep apnea, surgery and certain medicines can increase your risk for breathing problems. Follow instructions from your health care provider about wearing your sleep device: ? Anytime you are  sleeping, including during daytime naps. ? While taking prescription pain medicines, sleeping medicines, or medicines that make you drowsy.  Have a responsible adult stay with you for the time you are told. It is important to have someone help care for you until you are awake and alert.  Return to your normal activities as told by your health care provider. Ask your health care provider what activities are safe for you.  Take over-the-counter and prescription medicines only as told by your health care provider.  If you  smoke, do not smoke without supervision.  Keep all follow-up visits as told by your health care provider. This is important. Contact a health care provider if:  You have nausea or vomiting that does not get better with medicine.  You cannot eat or drink without vomiting.  You have pain that does not get better with medicine.  You are unable to pass urine.  You develop a skin rash.  You have a fever.  You have redness around your IV site that gets worse. Get help right away if:  You have difficulty breathing.  You have chest pain.  You have blood in your urine or stool, or you vomit blood. Summary  After the procedure, it is common to have a sore throat or nausea. It is also common to feel tired.  Have a responsible adult stay with you for the time you are told. It is important to have someone help care for you until you are awake and alert.  When you feel hungry, start by eating small amounts of foods that are soft and easy to digest (bland), such as toast. Gradually return to your regular diet.  Drink enough fluid to keep your urine pale yellow.  Return to your normal activities as told by your health care provider. Ask your health care provider what activities are safe for you. This information is not intended to replace advice given to you by your health care provider. Make sure you discuss any questions you have with your health care provider. Document  Revised: 12/22/2019 Document Reviewed: 07/21/2019 Elsevier Patient Education  2021 [employer].

## 2020-08-03 ENCOUNTER — Ambulatory Visit: Payer: Medicare Other | Admitting: Family Medicine

## 2020-08-07 ENCOUNTER — Encounter (HOSPITAL_COMMUNITY)
Admission: RE | Admit: 2020-08-07 | Discharge: 2020-08-07 | Disposition: A | Discharge status: Home / Self Care | Payer: Medicare Other | Source: Ambulatory Visit | Attending: Urology | Admitting: Urology

## 2020-08-07 ENCOUNTER — Other Ambulatory Visit (HOSPITAL_COMMUNITY)
Admission: RE | Admit: 2020-08-07 | Discharge: 2020-08-07 | Disposition: A | Discharge status: Home / Self Care | Payer: Medicare Other | Source: Ambulatory Visit | Attending: Urology | Admitting: Urology

## 2020-08-07 ENCOUNTER — Encounter (HOSPITAL_COMMUNITY): Payer: Self-pay

## 2020-08-07 ENCOUNTER — Other Ambulatory Visit: Payer: Self-pay

## 2020-08-07 DIAGNOSIS — Z01818 Encounter for other preprocedural examination: Secondary | ICD-10-CM | POA: Insufficient documentation

## 2020-08-07 DIAGNOSIS — Z20822 Contact with and (suspected) exposure to covid-19: Secondary | ICD-10-CM | POA: Insufficient documentation

## 2020-08-07 LAB — BASIC METABOLIC PANEL
Anion gap: 7 (ref 5–15)
BUN: 20 mg/dL (ref 8–23)
CO2: 27 mmol/L (ref 22–32)
Calcium: 9.5 mg/dL (ref 8.9–10.3)
Chloride: 102 mmol/L (ref 98–111)
Creatinine, Ser: 1.01 mg/dL (ref 0.61–1.24)
GFR, Estimated: >60 mL/min (ref >60)
Glucose, Bld: 115 mg/dL — ABNORMAL HIGH (ref 70–99)
Potassium: 4.3 mmol/L (ref 3.5–5.1)
Sodium: 136 mmol/L (ref 135–145)

## 2020-08-07 LAB — CBC WITH DIFFERENTIAL/PLATELET
Abs Immature Granulocytes: 0.01 10*3/uL (ref 0.00–0.07)
Basophils Absolute: 0 10*3/uL (ref 0.0–0.1)
Basophils Relative: 0 %
Eosinophils Absolute: 0.1 10*3/uL (ref 0.0–0.5)
Eosinophils Relative: 1 %
HCT: 39.4 % (ref 39.0–52.0)
Hemoglobin: 12.8 g/dL — ABNORMAL LOW (ref 13.0–17.0)
Immature Granulocytes: 0 %
Lymphocytes Relative: 25 %
Lymphs Abs: 1.5 10*3/uL (ref 0.7–4.0)
MCH: 30.3 pg (ref 26.0–34.0)
MCHC: 32.5 g/dL (ref 30.0–36.0)
MCV: 93.1 fL (ref 80.0–100.0)
Monocytes Absolute: 0.6 10*3/uL (ref 0.1–1.0)
Monocytes Relative: 11 %
Neutro Abs: 3.7 10*3/uL (ref 1.7–7.7)
Neutrophils Relative %: 63 %
Platelets: 178 10*3/uL (ref 150–400)
RBC: 4.23 MIL/uL (ref 4.22–5.81)
RDW: 13.8 % (ref 11.5–15.5)
WBC: 5.9 10*3/uL (ref 4.0–10.5)
nRBC: 0 % (ref 0.0–0.2)

## 2020-08-07 LAB — SARS CORONAVIRUS 2 (TAT 6-24 HRS): SARS Coronavirus 2: NEGATIVE

## 2020-08-08 ENCOUNTER — Telehealth: Payer: Self-pay | Admitting: Cardiology

## 2020-08-08 ENCOUNTER — Telehealth: Payer: Self-pay

## 2020-08-08 NOTE — Telephone Encounter (Signed)
[facility] pharmacy called requesting refill on Tamsulosin. Explained he had prescription enough for 1 year. Pharmacist then looked and said it was replaced with Sildosin and he has refills on this also. Said pt sent in request.

## 2020-08-08 NOTE — Telephone Encounter (Signed)
New Message    Spoke with patient this morning, we had a opening come available to bring patient in for his surgical clearance and he states he does not need it and he is going to proceed with surgery without cardiac clearance.

## 2020-08-09 ENCOUNTER — Other Ambulatory Visit: Payer: Self-pay

## 2020-08-09 ENCOUNTER — Encounter (HOSPITAL_COMMUNITY): Payer: Self-pay | Admitting: Urology

## 2020-08-09 ENCOUNTER — Encounter (HOSPITAL_COMMUNITY)
Admission: RE | Disposition: A | Discharge status: Home / Self Care | Payer: Self-pay | Source: Home / Self Care | Attending: Urology

## 2020-08-09 ENCOUNTER — Ambulatory Visit (HOSPITAL_COMMUNITY): Payer: Medicare Other | Admitting: Certified Registered Nurse Anesthetist

## 2020-08-09 ENCOUNTER — Ambulatory Visit (HOSPITAL_COMMUNITY)
Admission: RE | Admit: 2020-08-09 | Discharge: 2020-08-09 | Disposition: A | Discharge status: Home / Self Care | Payer: Medicare Other | Attending: Urology | Admitting: Urology

## 2020-08-09 DIAGNOSIS — N401 Enlarged prostate with lower urinary tract symptoms: Secondary | ICD-10-CM | POA: Insufficient documentation

## 2020-08-09 DIAGNOSIS — Z833 Family history of diabetes mellitus: Secondary | ICD-10-CM | POA: Diagnosis not present

## 2020-08-09 DIAGNOSIS — Z85048 Personal history of other malignant neoplasm of rectum, rectosigmoid junction, and anus: Secondary | ICD-10-CM | POA: Insufficient documentation

## 2020-08-09 DIAGNOSIS — Z951 Presence of aortocoronary bypass graft: Secondary | ICD-10-CM | POA: Diagnosis not present

## 2020-08-09 DIAGNOSIS — I2510 Atherosclerotic heart disease of native coronary artery without angina pectoris: Secondary | ICD-10-CM | POA: Diagnosis not present

## 2020-08-09 DIAGNOSIS — N138 Other obstructive and reflux uropathy: Secondary | ICD-10-CM | POA: Diagnosis not present

## 2020-08-09 DIAGNOSIS — Z79899 Other long term (current) drug therapy: Secondary | ICD-10-CM | POA: Diagnosis not present

## 2020-08-09 DIAGNOSIS — I10 Essential (primary) hypertension: Secondary | ICD-10-CM | POA: Insufficient documentation

## 2020-08-09 HISTORY — PX: CYSTOSCOPY WITH INSERTION OF UROLIFT: SHX6678

## 2020-08-09 SURGERY — CYSTOSCOPY WITH INSERTION OF UROLIFT
Anesthesia: General | Site: Bladder

## 2020-08-09 MED ORDER — PROPOFOL 500 MG/50ML IV EMUL
INTRAVENOUS | Status: DC | PRN
  Administered 2020-08-09: 50 ug/kg/min via INTRAVENOUS

## 2020-08-09 MED ORDER — LACTATED RINGERS IV SOLN: INTRAVENOUS | Status: DC

## 2020-08-09 MED ORDER — CHLORHEXIDINE GLUCONATE 0.12 % MT SOLN
OROMUCOSAL | Status: AC
  Filled 2020-08-09: qty 15

## 2020-08-09 MED ORDER — ORAL CARE MOUTH RINSE: 15.0000 mL | Freq: Once | OROMUCOSAL | Status: DC

## 2020-08-09 MED ORDER — LIDOCAINE HCL URETHRAL/MUCOSAL 2 % EX GEL
CUTANEOUS | Status: DC | PRN
  Administered 2020-08-09: 1

## 2020-08-09 MED ORDER — LIDOCAINE HCL URETHRAL/MUCOSAL 2 % EX GEL
CUTANEOUS | Status: AC
  Filled 2020-08-09: qty 10

## 2020-08-09 MED ORDER — WATER FOR IRRIGATION, STERILE IR SOLN
Status: DC | PRN
  Administered 2020-08-09: 3000 mL

## 2020-08-09 MED ORDER — LACTATED RINGERS IV SOLN: INTRAVENOUS | Status: DC | PRN

## 2020-08-09 MED ORDER — CHLORHEXIDINE GLUCONATE 0.12 % MT SOLN: 15.0000 mL | Freq: Once | OROMUCOSAL | Status: DC

## 2020-08-09 MED ORDER — LIDOCAINE HCL (CARDIAC) PF 100 MG/5ML IV SOSY
PREFILLED_SYRINGE | INTRAVENOUS | Status: DC | PRN
  Administered 2020-08-09: 40 mg via INTRATRACHEAL

## 2020-08-09 MED ORDER — PROPOFOL 10 MG/ML IV BOLUS
INTRAVENOUS | Status: DC | PRN
  Administered 2020-08-09: 50 mg via INTRAVENOUS
  Administered 2020-08-09: 20 mg via INTRAVENOUS

## 2020-08-09 MED ORDER — ONDANSETRON HCL 4 MG/2ML IJ SOLN
INTRAMUSCULAR | Status: AC
  Filled 2020-08-09: qty 2

## 2020-08-09 MED ORDER — PROPOFOL 10 MG/ML IV BOLUS
INTRAVENOUS | Status: AC
  Filled 2020-08-09: qty 40

## 2020-08-09 MED ORDER — CEFAZOLIN SODIUM-DEXTROSE 2-4 GM/100ML-% IV SOLN
2.0000 g | INTRAVENOUS | Status: AC
  Administered 2020-08-09: 2 g via INTRAVENOUS

## 2020-08-09 MED ORDER — DEXAMETHASONE SODIUM PHOSPHATE 10 MG/ML IJ SOLN
8.0000 mg | Freq: Once | INTRAMUSCULAR | Status: DC | PRN
  Filled 2020-08-09: qty 0.8

## 2020-08-09 MED ORDER — LIDOCAINE HCL (PF) 2 % IJ SOLN
INTRAMUSCULAR | Status: AC
  Filled 2020-08-09: qty 5

## 2020-08-09 MED ORDER — FENTANYL CITRATE (PF) 100 MCG/2ML IJ SOLN
INTRAMUSCULAR | Status: AC
  Filled 2020-08-09: qty 2

## 2020-08-09 MED ORDER — FENTANYL CITRATE (PF) 100 MCG/2ML IJ SOLN
INTRAMUSCULAR | Status: DC | PRN
  Administered 2020-08-09 (×2): 25 ug via INTRAVENOUS

## 2020-08-09 MED ORDER — FENTANYL CITRATE (PF) 100 MCG/2ML IJ SOLN: 25.0000 ug | INTRAMUSCULAR | Status: DC | PRN

## 2020-08-09 MED ORDER — CEFAZOLIN SODIUM-DEXTROSE 2-4 GM/100ML-% IV SOLN
INTRAVENOUS | Status: AC
  Filled 2020-08-09: qty 100

## 2020-08-09 SURGICAL SUPPLY — 18 items
BAG DRAIN URO TABLE W/ADPT NS (BAG) ×2 IMPLANT
BAG DRN 8 ADPR NS SKTRN CSTL (BAG) ×1
BAG HAMPER (MISCELLANEOUS) ×2 IMPLANT
CLOTH BEACON ORANGE TIMEOUT ST (SAFETY) ×2 IMPLANT
GLOVE BIO SURGEON STRL SZ8 (GLOVE) ×2 IMPLANT
GLOVE SURG UNDER POLY LF SZ7 (GLOVE) ×4 IMPLANT
GOWN STRL REUS W/TWL LRG LVL3 (GOWN DISPOSABLE) ×2 IMPLANT
GOWN STRL REUS W/TWL XL LVL3 (GOWN DISPOSABLE) ×2 IMPLANT
KIT TURNOVER CYSTO (KITS) ×2 IMPLANT
MANIFOLD NEPTUNE II (INSTRUMENTS) ×2 IMPLANT
PACK CYSTO (CUSTOM PROCEDURE TRAY) ×2 IMPLANT
PAD ARMBOARD 7.5X6 YLW CONV (MISCELLANEOUS) ×4 IMPLANT
SYSTEM UROLIFT (Male Continence) ×12 IMPLANT
TOWEL OR 17X26 4PK STRL BLUE (TOWEL DISPOSABLE) ×2 IMPLANT
TRAY FOLEY W/BAG SLVR 16FR (SET/KITS/TRAYS/PACK) ×2
TRAY FOLEY W/BAG SLVR 16FR ST (SET/KITS/TRAYS/PACK) ×1 IMPLANT
WATER STERILE IRR 3000ML UROMA (IV SOLUTION) ×2 IMPLANT
WATER STERILE IRR 500ML POUR (IV SOLUTION) ×2 IMPLANT

## 2020-08-09 NOTE — Op Note (Signed)
   PREOPERATIVE DIAGNOSIS: Benign prostatic hypertrophy with bladder outlet obstruction.  POSTOPERATIVE DIAGNOSIS: Benign prostatic hypertrophy with bladder outlet obstruction.  PROCEDURE: Cystoscopy with implantation of UroLift devices, 6 implants.  SURGEON: Nicolette Bang, M.D.  ANESTHESIA: MAC  ANTIBIOTICS: ancef  SPECIMEN: None.  DRAINS: A 16-French Foley catheter.  BLOOD LOSS: Minimal.  COMPLICATIONS: None.  INDICATIONS:The Patient is an 86 year old male with BPH and bladder outlet obstruction. He has failed medical therapy and has elected UroLift for definitive treatment.  FINDINGS OF PROCEDURE: He was taken to the operating room where a genral anesthetic was induced. He was placed in lithotomy position and was fitted with PAS hose. His perineum and genitalia were prepped with chlorhexidine, and he was draped in usual sterile fashion.  Cystoscopy was performed using the UroLift scope and 0 degree lens. Examination revealed a normal urethra. The external sphincter was intact. Prostatic urethra was approximately 4 cm in length with lateral lobe enlargement. There was also little bit of bladder neck elevation. Inspection of bladder revealed mild-to-moderate trabeculation with no tumors, stones, or inflammation. No cellules or diverticula were noted. Ureteral orifices were in their normal anatomic position effluxing clear urine.  After initial cystoscopy, the visual obturator was replaced with the first UroLift device. This was turned to the 9 o'clock position and pulled back to the veru and then slightly advanced. Pressure was then applied to the right lateral lobe and the UroLift device was deployed.  The second UroLift device was then inserted and applied to the left lateral lobe at 3 o'clock and deployed in the mid prostatic urethra. After this, there was still some apparent obstruction closer to the bladder neck. So a second level of  UroLift device was applied between the mid urethra and the proximal urethra providing further. We then placed a 3rd level of implants at the mid prostate gland for a total of 6 implants. patency to the prostatic urethra. At this point, there was mild bleeding but the patient did have a spinal anesthetic. So it was thought that a Foley catheter was indicated. The scope was removed and a 16-French Foley catheter was inserted without difficulty. The balloon was filled with 10 mL sterile fluid, and the catheter was placed to straight drainage.  COMPLICATIONS: None   CONDITION: Stable, extubated, transferred to PACU  PLAN: The patient will be discharged home and followup in 2 days for a voiding trial.

## 2020-08-09 NOTE — Discharge Instructions (Signed)
Indwelling Urinary Catheter Care, Adult An indwelling urinary catheter is a thin tube that is put into your bladder. The tube helps to drain pee (urine) out of your body. The tube goes in through your urethra. Your urethra is where pee comes out of your body. Your pee will come out through the catheter, then it will go into a bag (drainage bag). Take good care of your catheter so it will work well. How to wear your catheter and bag Supplies needed  Sticky tape (adhesive tape) or a leg strap.  Alcohol wipe or soap and water (if you use tape).  A clean towel (if you use tape).  Large overnight bag.  Smaller bag (leg bag). Wearing your catheter Attach your catheter to your leg with tape or a leg strap.  Make sure the catheter is not pulled tight.  If a leg strap gets wet, take it off and put on a dry strap.  If you use tape to hold the bag on your leg: 1. Use an alcohol wipe or soap and water to wash your skin where the tape made it sticky before. 2. Use a clean towel to pat-dry that skin. 3. Use new tape to make the bag stay on your leg. Wearing your bags You should have been given a large overnight bag.  You may wear the overnight bag in the day or night.  Always have the overnight bag lower than your bladder.  Do not let the bag touch the floor.  Before you go to sleep, put a clean plastic bag in a wastebasket. Then hang the overnight bag inside the wastebasket. You should also have a smaller leg bag that fits under your clothes.  Always wear the leg bag below your knee.  Do not wear your leg bag at night. How to care for your skin and catheter Supplies needed  A clean washcloth.  Water and mild soap.  A clean towel. Caring for your skin and catheter  Clean the skin around your catheter every day: 1. Wash your hands with soap and water. 2. Wet a clean washcloth in warm water and mild soap. 3. Clean the skin around your urethra.  If you are male:  Gently  spread the folds of skin around your vagina (labia).  With the washcloth in your other hand, wipe the inner side of your labia on each side. Wipe from front to back.  If you are male:  Pull back any skin that covers the end of your penis (foreskin).  With the washcloth in your other hand, wipe your penis in small circles. Start wiping at the tip of your penis, then move away from the catheter.  Move the foreskin back in place, if needed. 4. With your free hand, hold the catheter close to where it goes into your body.  Keep holding the catheter during cleaning so it does not get pulled out. 5. With the washcloth in your other hand, clean the catheter.  Only wipe downward on the catheter.  Do not wipe upward toward your body. Doing this may push germs into your urethra and cause infection. 6. Use a clean towel to pat-dry the catheter and the skin around it. Make sure to wipe off all soap. 7. Wash your hands with soap and water.  Shower every day. Do not take baths.  Do not use cream, ointment, or lotion on the area where the catheter goes into your body, unless your doctor tells you to.  Do not   use powders, sprays, or lotions on your genital area.  Check your skin around the catheter every day for signs of infection. Check for: ? Redness, swelling, or pain. ? Fluid or blood. ? Warmth. ? Pus or a bad smell.      How to empty the bag Supplies needed  Rubbing alcohol.  Gauze pad or cotton ball.  Tape or a leg strap. Emptying the bag Pour the pee out of your bag when it is ?- full, or at least 2-3 times a day. Do this for your overnight bag and your leg bag. 1. Wash your hands with soap and water. 2. Separate (detach) the bag from your leg. 3. Hold the bag over the toilet or a clean pail. Keep the bag lower than your hips and bladder. This is so the pee (urine) does not go back into the tube. 4. Open the pour spout. It is at the bottom of the bag. 5. Empty the pee into the  toilet or pail. Do not let the pour spout touch any surface. 6. Put rubbing alcohol on a gauze pad or cotton ball. 7. Use the gauze pad or cotton ball to clean the pour spout. 8. Close the pour spout. 9. Attach the bag to your leg with tape or a leg strap. 10. Wash your hands with soap and water. Follow instructions for cleaning the drainage bag:  From the product maker.  As told by your doctor. How to change the bag Supplies needed  Alcohol wipes.  A clean bag.  Tape or a leg strap. Changing the bag Replace your bag when it starts to leak, smell bad, or look dirty. 1. Wash your hands with soap and water. 2. Separate the dirty bag from your leg. 3. Pinch the catheter with your fingers so that pee does not spill out. 4. Separate the catheter tube from the bag tube where these tubes connect (at the connection valve). Do not let the tubes touch any surface. 5. Clean the end of the catheter tube with an alcohol wipe. Use a different alcohol wipe to clean the end of the bag tube. 6. Connect the catheter tube to the tube of the clean bag. 7. Attach the clean bag to your leg with tape or a leg strap. Do not make the bag tight on your leg. 8. Wash your hands with soap and water. General rules  Never pull on your catheter. Never try to take it out. Doing that can hurt you.  Always wash your hands before and after you touch your catheter or bag. Use a mild, fragrance-free soap. If you do not have soap and water, use hand sanitizer.  Always make sure there are no twists or bends (kinks) in the catheter tube.  Always make sure there are no leaks in the catheter or bag.  Drink enough fluid to keep your pee pale yellow.  Do not take baths, swim, or use a hot tub.  If you are male, wipe from front to back after you poop (have a bowel movement).   Contact a doctor if:  Your pee is cloudy.  Your pee smells worse than usual.  Your catheter gets clogged.  Your catheter  leaks.  Your bladder feels full. Get help right away if:  You have redness, swelling, or pain where the catheter goes into your body.  You have fluid, blood, pus, or a bad smell coming from the area where the catheter goes into your body.  Your skin feels   warm where the catheter goes into your body.  You have a fever.  You have pain in your: ? Belly (abdomen). ? Legs. ? Lower back. ? Bladder.  You see blood in the catheter.  Your pee is pink or red.  You feel sick to your stomach (nauseous).  You throw up (vomit).  You have chills.  Your pee is not draining into the bag.  Your catheter gets pulled out. Summary  An indwelling urinary catheter is a thin tube that is placed into the bladder to help drain pee (urine) out of the body.  The catheter is placed into the part of the body that drains pee from the bladder (urethra).  Taking good care of your catheter will keep it working properly and help prevent problems.  Always wash your hands before and after touching your catheter or bag.  Never pull on your catheter or try to take it out. This information is not intended to replace advice given to you by your health care provider. Make sure you discuss any questions you have with your health care provider. Document Revised: 07/30/2018 Document Reviewed: 11/21/2016 Elsevier Patient Education  2021 [place] Anesthesia, Adult, Care After This sheet gives you information about how to care for yourself after your procedure. Your health care provider may also give you more specific instructions. If you have problems or questions, contact your health care provider. What can I expect after the procedure? After the procedure, the following side effects are common:  Pain or discomfort at the IV site.  Nausea.  Vomiting.  Sore throat.  Trouble concentrating.  Feeling cold or chills.  Feeling weak or tired.  Sleepiness and fatigue.  Soreness and  body aches. These side effects can affect parts of the body that were not involved in surgery. Follow these instructions at home: For the time period you were told by your health care provider:  Rest.  Do not participate in activities where you could fall or become injured.  Do not drive or use machinery.  Do not drink alcohol.  Do not take sleeping pills or medicines that cause drowsiness.  Do not make important decisions or sign legal documents.  Do not take care of children on your own.   Eating and drinking  Follow any instructions from your health care provider about eating or drinking restrictions.  When you feel hungry, start by eating small amounts of foods that are soft and easy to digest (bland), such as toast. Gradually return to your regular diet.  Drink enough fluid to keep your urine pale yellow.  If you vomit, rehydrate by drinking water, juice, or clear broth. General instructions  If you have sleep apnea, surgery and certain medicines can increase your risk for breathing problems. Follow instructions from your health care provider about wearing your sleep device: ? Anytime you are sleeping, including during daytime naps. ? While taking prescription pain medicines, sleeping medicines, or medicines that make you drowsy.  Have a responsible adult stay with you for the time you are told. It is important to have someone help care for you until you are awake and alert.  Return to your normal activities as told by your health care provider. Ask your health care provider what activities are safe for you.  Take over-the-counter and prescription medicines only as told by your health care provider.  If you smoke, do not smoke without supervision.  Keep all follow-up visits as told by your  health care provider. This is important. Contact a health care provider if:  You have nausea or vomiting that does not get better with medicine.  You cannot eat or drink without  vomiting.  You have pain that does not get better with medicine.  You are unable to pass urine.  You develop a skin rash.  You have a fever.  You have redness around your IV site that gets worse. Get help right away if:  You have difficulty breathing.  You have chest pain.  You have blood in your urine or stool, or you vomit blood. Summary  After the procedure, it is common to have a sore throat or nausea. It is also common to feel tired.  Have a responsible adult stay with you for the time you are told. It is important to have someone help care for you until you are awake and alert.  When you feel hungry, start by eating small amounts of foods that are soft and easy to digest (bland), such as toast. Gradually return to your regular diet.  Drink enough fluid to keep your urine pale yellow.  Return to your normal activities as told by your health care provider. Ask your health care provider what activities are safe for you. This information is not intended to replace advice given to you by your health care provider. Make sure you discuss any questions you have with your health care provider. Document Revised: 12/22/2019 Document Reviewed: 07/21/2019 Elsevier Patient Education  2021 [employer].

## 2020-08-09 NOTE — Transfer of Care (Signed)
Immediate Anesthesia Transfer of Care Note  Patient: [name]  Procedure(s) Performed: CYSTOSCOPY WITH INSERTION OF UROLIFT (N/A Bladder)  Patient Location: PACU  Anesthesia Type:General  Level of Consciousness: awake and alert   Airway & Oxygen Therapy: Patient Spontanous Breathing  Post-op Assessment: Report given to RN and Post -op Vital signs reviewed and stable  Post vital signs: Reviewed and stable  Last Vitals:  Vitals Value Taken Time  BP 101/55   Temp 97.6   Pulse 66   Resp    SpO2 97     Last Pain:  Vitals:   08/09/20 0704  TempSrc: Oral  PainSc: 2       Patients Stated Pain Goal: 7 (84/[id number])  Complications: No complications documented.

## 2020-08-09 NOTE — H&P (Signed)
Urology Admission H&P  Chief Complaint: urinary retnetion  History of Present Illness: [name] is a 86yo here for Urolift. He has failed multiple BPH therapies.   Past Medical History:  Diagnosis Date  . CAD (coronary artery disease)    Status post CABG 1999  . Essential hypertension   . Hyperlipidemia   . Rectal cancer [facility])    Past Surgical History:  Procedure Laterality Date  . CATARACT EXTRACTION W/PHACO Left 06/25/2015   Procedure: CATARACT EXTRACTION PHACO AND INTRAOCULAR LENS PLACEMENT; CDE:  4.69;  Surgeon: Williams Che, MD;  Location: [facility];  Service: Ophthalmology;  Laterality: Left;  . COLOSTOMY    . CORONARY ARTERY BYPASS GRAFT  1999  . RECTAL SURGERY      Home Medications:  Current Facility-Administered Medications  Medication Dose Route Frequency Provider Last Rate Last Admin  . ceFAZolin (ANCEF) 2-4 GM/100ML-% IVPB           . ceFAZolin (ANCEF) IVPB 2g/100 mL premix  2 g Intravenous 30 min Pre-Op Maalik Pinn, Candee Furbish, MD      . chlorhexidine (PERIDEX) 0.12 % solution            Allergies: No Known Allergies  Family History  Problem Relation Age of Onset  . Alcoholism Father   . Diabetes Mellitus II Father    Social History:  reports that he has never smoked. His smokeless tobacco use includes chew. He reports that he does not drink alcohol and does not use drugs.  Review of Systems  Genitourinary: Positive for difficulty urinating.  All other systems reviewed and are negative.   Physical Exam:  Vital signs in last 24 hours: Temp:  [97.1 F (36.2 C)] 97.1 F (36.2 C) (04/21 0704) Pulse Rate:  [73] 73 (04/21 0704) Resp:  [15] 15 (04/21 0704) BP: (157)/(70) 157/70 (04/21 0704) SpO2:  [100 %] 100 % (04/21 0704) Weight:  [61 kg] 61 kg (04/21 0704) Physical Exam Vitals reviewed.  Constitutional:      Appearance: Normal appearance.  HENT:     Head: Normocephalic and atraumatic.     Nose: Nose normal.     Mouth/Throat:     Mouth: Mucous  membranes are dry.  Eyes:     Extraocular Movements: Extraocular movements intact.     Pupils: Pupils are equal, round, and reactive to light.  Cardiovascular:     Rate and Rhythm: Normal rate and regular rhythm.  Pulmonary:     Effort: Pulmonary effort is normal. No respiratory distress.  Abdominal:     General: Abdomen is flat. There is no distension.  Musculoskeletal:        General: No swelling. Normal range of motion.     Cervical back: Normal range of motion and neck supple.  Skin:    General: Skin is warm and dry.  Neurological:     General: No focal deficit present.     Mental Status: He is alert and oriented to person, place, and time.  Psychiatric:        Mood and Affect: Mood normal.        Behavior: Behavior normal.        Thought Content: Thought content normal.        Judgment: Judgment normal.     Laboratory Data:  No results found for this or any previous visit (from the past 24 hour(s)). Recent Results (from the past 240 hour(s))  SARS CORONAVIRUS 2 (TAT 6-24 HRS) Nasopharyngeal Nasopharyngeal Swab  Status: None   Collection Time: 08/07/20  1:28 PM   Specimen: Nasopharyngeal Swab  Result Value Ref Range Status   SARS Coronavirus 2 NEGATIVE NEGATIVE Final    Comment: (NOTE) SARS-CoV-2 target nucleic acids are NOT DETECTED.  The SARS-CoV-2 RNA is generally detectable in upper and lower respiratory specimens during the acute phase of infection. Negative results do not preclude SARS-CoV-2 infection, do not rule out co-infections with other pathogens, and should not be used as the sole basis for treatment or other patient management decisions. Negative results must be combined with clinical observations, patient history, and epidemiological information. The expected result is Negative.  Fact Sheet for Patients: SugarRoll.be  Fact Sheet for Healthcare Providers: https://www.woods-mathews.com/  This test is not  yet approved or cleared by the [place] FDA and  has been authorized for detection and/or diagnosis of SARS-CoV-2 by FDA under an Emergency Use Authorization (EUA). This EUA will remain  in effect (meaning this test can be used) for the duration of the COVID-19 declaration under Se ction 564(b)(1) of the Act, 21 U.S.C. section 360bbb-3(b)(1), unless the authorization is terminated or revoked sooner.  Performed at [facility], [place] [address] [place], [place] [address]    Creatinine: Recent Labs    08/07/20 1328  CREATININE 1.01   Baseline Creatinine: 1  Impression/Assessment:  86yo with BPH and incomplete emptying  Plan:  We discussed the management of his BPH including continued medical therapy, Rezum, Urolift, TURP and simple prostatectomy. After discussing the options the patient has elected to proceed with urolift. Risks/benefits/alternatives discussed.   Nicolette Bang 08/09/2020, 7:35 AM

## 2020-08-09 NOTE — Anesthesia Postprocedure Evaluation (Signed)
Anesthesia Post Note  Patient: [name]  Procedure(s) Performed: CYSTOSCOPY WITH INSERTION OF UROLIFT (N/A Bladder)  Patient location during evaluation: Phase II Anesthesia Type: General Level of consciousness: awake Pain management: pain level controlled Vital Signs Assessment: post-procedure vital signs reviewed and stable Respiratory status: spontaneous breathing and respiratory function stable Cardiovascular status: blood pressure returned to baseline and stable Postop Assessment: no headache and no apparent nausea or vomiting Anesthetic complications: no Comments: Late entry   No complications documented.   Last Vitals:  Vitals:   08/09/20 0845 08/09/20 0852  BP: 105/70 118/60  Pulse: 75 69  Resp: 13 18  Temp:  36.5 C  SpO2: 96% 99%    Last Pain:  Vitals:   08/09/20 0852  TempSrc: Oral  PainSc: 0-No pain                 Louann Sjogren

## 2020-08-09 NOTE — Anesthesia Preprocedure Evaluation (Signed)
Anesthesia Evaluation  Patient identified by MRN, date of birth, ID band Patient awake    Reviewed: Allergy & Precautions, H&P , NPO status , Patient's Chart, lab work & pertinent test results, reviewed documented beta blocker date and time   Airway Mallampati: II  TM Distance: >3 FB Neck ROM: full    Dental no notable dental hx.    Pulmonary neg pulmonary ROS,    Pulmonary exam normal breath sounds clear to auscultation       Cardiovascular Exercise Tolerance: Good hypertension, + CAD and + CABG   Rhythm:regular Rate:Normal     Neuro/Psych negative neurological ROS  negative psych ROS   GI/Hepatic negative GI ROS, Neg liver ROS,   Endo/Other  negative endocrine ROS  Renal/GU negative Renal ROS  negative genitourinary   Musculoskeletal   Abdominal   Peds  Hematology negative hematology ROS (+)   Anesthesia Other Findings   Reproductive/Obstetrics negative OB ROS                             Anesthesia Physical Anesthesia Plan  ASA: III  Anesthesia Plan: General   Post-op Pain Management:    Induction:   PONV Risk Score and Plan: Propofol infusion  Airway Management Planned:   Additional Equipment:   Intra-op Plan:   Post-operative Plan:   Informed Consent: I have reviewed the patients History and Physical, chart, labs and discussed the procedure including the risks, benefits and alternatives for the proposed anesthesia with the patient or authorized representative who has indicated his/her understanding and acceptance.     Dental Advisory Given  Plan Discussed with: CRNA  Anesthesia Plan Comments:         Anesthesia Quick Evaluation

## 2020-08-10 ENCOUNTER — Encounter (HOSPITAL_COMMUNITY): Payer: Self-pay | Admitting: Urology

## 2020-08-10 ENCOUNTER — Other Ambulatory Visit: Payer: Self-pay

## 2020-08-10 NOTE — Progress Notes (Signed)
Opened in error

## 2020-08-13 ENCOUNTER — Ambulatory Visit (INDEPENDENT_AMBULATORY_CARE_PROVIDER_SITE_OTHER): Payer: Medicare Other | Admitting: Urology

## 2020-08-13 ENCOUNTER — Other Ambulatory Visit: Payer: Self-pay

## 2020-08-13 VITALS — BP 126/56 | HR 75 | Temp 97.7°F | Ht 68.0 in | Wt 135.0 lb

## 2020-08-13 DIAGNOSIS — R339 Retention of urine, unspecified: Secondary | ICD-10-CM | POA: Diagnosis not present

## 2020-08-13 NOTE — Progress Notes (Signed)
Fill and Pull Catheter Removal  Patient is present today for a catheter removal.  Patient was cleaned and prepped in a sterile fashion  173ml of sterile water/ saline was instilled into the bladder when the patient felt the urge to urinate. 74ml of water was then drained from the balloon.  A 16FR foley cath was removed from the bladder no complications were noted .  Patient as then given some time to void on their own.  Patient can void  110ml on their own after some time.  Patient tolerated well.  Performed by: Antionette Char, Robt Okuda,LPN  Follow up/ Additional notes: 2 week nurse visit

## 2020-08-13 NOTE — Progress Notes (Signed)
Uroflow  Peak Flow: 62ml Average Flow: 87ml Voided Volume: 113ml Voiding Time: 37sec Flow Time: 28sec Time to Peak Flow: 19sec  PVR Volume: 141ml

## 2020-08-13 NOTE — Progress Notes (Signed)
Urological Symptom Review  Patient is experiencing the following symptoms: Patient has catheter   Review of Systems  Gastrointestinal (upper)  : Negative for upper GI symptoms  Gastrointestinal (lower) : Negative for lower GI symptoms  Constitutional : Negative for symptoms  Skin: Negative for skin symptoms  Eyes: Negative for eye symptoms  Ear/Nose/Throat : Negative for Ear/Nose/Throat symptoms  Hematologic/Lymphatic: Negative for Hematologic/Lymphatic symptoms  Cardiovascular : Negative for cardiovascular symptoms  Respiratory : Negative for respiratory symptoms  Endocrine: Negative for endocrine symptoms  Musculoskeletal: Negative for musculoskeletal symptoms  Neurological: Negative for neurological symptoms  Psychologic: Negative for psychiatric symptoms  

## 2020-08-27 ENCOUNTER — Ambulatory Visit (INDEPENDENT_AMBULATORY_CARE_PROVIDER_SITE_OTHER): Payer: Medicare Other

## 2020-08-27 ENCOUNTER — Other Ambulatory Visit: Payer: Self-pay

## 2020-08-27 DIAGNOSIS — N401 Enlarged prostate with lower urinary tract symptoms: Secondary | ICD-10-CM

## 2020-08-27 DIAGNOSIS — N138 Other obstructive and reflux uropathy: Secondary | ICD-10-CM

## 2020-08-27 NOTE — Progress Notes (Signed)
Patient here today for PVR  PVR 0 after voiding.  Follow up in 1 month with MD PVR

## 2020-08-28 ENCOUNTER — Encounter: Payer: Self-pay | Admitting: Urology

## 2020-08-28 DIAGNOSIS — I10 Essential (primary) hypertension: Secondary | ICD-10-CM | POA: Diagnosis not present

## 2020-08-28 DIAGNOSIS — E782 Mixed hyperlipidemia: Secondary | ICD-10-CM | POA: Diagnosis not present

## 2020-08-28 NOTE — Progress Notes (Signed)
08/13/2020 8:06 AM   [name] Oct 09, 1934 [id number]  Referring provider: Celene Squibb, MD [place],  [place] [address]  followup BPH  HPI: [name] is a 86yo here for voiding trial after urolift. No hematuria. No pelvic pain. Voiding trial passed today.    PMH: Past Medical History:  Diagnosis Date  . CAD (coronary artery disease)    Status post CABG 1999  . Essential hypertension   . Hyperlipidemia   . Rectal cancer [facility])     Surgical History: Past Surgical History:  Procedure Laterality Date  . CATARACT EXTRACTION W/PHACO Left 06/25/2015   Procedure: CATARACT EXTRACTION PHACO AND INTRAOCULAR LENS PLACEMENT; CDE:  4.69;  Surgeon: Williams Che, MD;  Location: [facility];  Service: Ophthalmology;  Laterality: Left;  . COLOSTOMY    . CORONARY ARTERY BYPASS GRAFT  1999  . CYSTOSCOPY WITH INSERTION OF UROLIFT N/A 08/09/2020   Procedure: CYSTOSCOPY WITH INSERTION OF UROLIFT;  Surgeon: Cleon Gustin, MD;  Location: [facility];  Service: Urology;  Laterality: N/A;  . RECTAL SURGERY      Home Medications:  Allergies as of 08/13/2020   No Known Allergies     Medication List       Accurate as of August 13, 2020 11:59 PM. If you have any questions, ask your nurse or doctor.        ALPRAZolam 0.5 MG tablet Commonly known as: XANAX Take 0.5 mg by mouth daily.   amLODipine 5 MG tablet Commonly known as: NORVASC Take 5 mg by mouth daily.   atorvastatin 10 MG tablet Commonly known as: LIPITOR Take 10 mg by mouth daily.   fish oil-omega-3 fatty acids 1000 MG capsule Take 3 g by mouth daily.   folic acid 735 MCG tablet Commonly known as: FOLVITE Take 400 mcg by mouth daily.   losartan 50 MG tablet Commonly known as: COZAAR Take 50 mg by mouth daily.   multivitamin tablet Take 1 tablet by mouth daily.   tamsulosin 0.4 MG Caps capsule Commonly known as: FLOMAX Take 1 capsule (0.4 mg total) by mouth at bedtime.       Allergies: No  Known Allergies  Family History: Family History  Problem Relation Age of Onset  . Alcoholism Father   . Diabetes Mellitus II Father     Social History:  reports that he has never smoked. His smokeless tobacco use includes chew. He reports that he does not drink alcohol and does not use drugs.  ROS: All other review of systems were reviewed and are negative except what is noted above in HPI  Physical Exam: BP (!) 126/56   Pulse 75   Temp 97.7 F (36.5 C)   Ht 5\' 8"  (1.727 m)   Wt 135 lb (61.2 kg)   BMI 20.53 kg/m   Constitutional:  Alert and oriented, No acute distress. HEENT: [place] AT, moist mucus membranes.  Trachea midline, no masses. Cardiovascular: No clubbing, cyanosis, or edema. Respiratory: Normal respiratory effort, no increased work of breathing. GI: Abdomen is soft, nontender, nondistended, no abdominal masses GU: No CVA tenderness.  Lymph: No cervical or inguinal lymphadenopathy. Skin: No rashes, bruises or suspicious lesions. Neurologic: Grossly intact, no focal deficits, moving all 4 extremities. Psychiatric: Normal mood and affect.  Laboratory Data: Lab Results  Component Value Date   WBC 5.9 08/07/2020   HGB 12.8 (L) 08/07/2020   HCT 39.4 08/07/2020   MCV 93.1 08/07/2020   PLT 178 08/07/2020  Lab Results  Component Value Date   CREATININE 1.01 08/07/2020    No results found for: PSA  No results found for: TESTOSTERONE  No results found for: HGBA1C  Urinalysis    Component Value Date/Time   COLORURINE YELLOW 03/31/2020 1614   APPEARANCEUR Clear 05/24/2020 0941   LABSPEC 1.015 03/31/2020 1614   PHURINE 5.0 03/31/2020 1614   GLUCOSEU Negative 05/24/2020 0941   HGBUR SMALL (A) 03/31/2020 1614   BILIRUBINUR Negative 05/24/2020 [place] 03/31/2020 1614   PROTEINUR Negative 05/24/2020 0941   PROTEINUR NEGATIVE 03/31/2020 1614   NITRITE Negative 05/24/2020 0941   NITRITE POSITIVE (A) 03/31/2020 1614   LEUKOCYTESUR Trace (A)  05/24/2020 0941   LEUKOCYTESUR LARGE (A) 03/31/2020 1614    Lab Results  Component Value Date   LABMICR See below: 05/24/2020   WBCUA 6-10 (A) 05/24/2020   LABEPIT None seen 05/24/2020   MUCUS Present 05/22/2020   BACTERIA Many (A) 05/24/2020    Pertinent Imaging:  No results found for this or any previous visit.  No results found for this or any previous visit.  No results found for this or any previous visit.  No results found for this or any previous visit.  No results found for this or any previous visit.  No results found for this or any previous visit.  No results found for this or any previous visit.  No results found for this or any previous visit.   Assessment & Plan:    1. Urinary retention -RTC 1 week for PVR   No follow-ups on file.  Nicolette Bang, MD  [facility] [place]

## 2020-08-28 NOTE — Patient Instructions (Signed)

## 2020-08-31 ENCOUNTER — Ambulatory Visit: Payer: Medicare Other | Admitting: Urology

## 2020-09-03 DIAGNOSIS — E785 Hyperlipidemia, unspecified: Secondary | ICD-10-CM | POA: Diagnosis not present

## 2020-09-03 DIAGNOSIS — I10 Essential (primary) hypertension: Secondary | ICD-10-CM | POA: Diagnosis not present

## 2020-09-03 DIAGNOSIS — I4891 Unspecified atrial fibrillation: Secondary | ICD-10-CM | POA: Diagnosis not present

## 2020-09-06 DIAGNOSIS — E7849 Other hyperlipidemia: Secondary | ICD-10-CM | POA: Diagnosis not present

## 2020-09-06 DIAGNOSIS — I10 Essential (primary) hypertension: Secondary | ICD-10-CM | POA: Diagnosis not present

## 2020-09-06 DIAGNOSIS — I4891 Unspecified atrial fibrillation: Secondary | ICD-10-CM | POA: Diagnosis not present

## 2020-10-15 ENCOUNTER — Ambulatory Visit: Payer: Medicare Other | Admitting: Urology

## 2020-11-07 ENCOUNTER — Other Ambulatory Visit: Payer: Self-pay

## 2020-11-07 ENCOUNTER — Encounter: Payer: Self-pay | Admitting: Urology

## 2020-11-07 ENCOUNTER — Ambulatory Visit (INDEPENDENT_AMBULATORY_CARE_PROVIDER_SITE_OTHER): Payer: Medicare Other | Admitting: Urology

## 2020-11-07 VITALS — BP 130/60 | HR 78 | Temp 98.4°F | Wt 125.8 lb

## 2020-11-07 DIAGNOSIS — N138 Other obstructive and reflux uropathy: Secondary | ICD-10-CM

## 2020-11-07 DIAGNOSIS — N401 Enlarged prostate with lower urinary tract symptoms: Secondary | ICD-10-CM | POA: Diagnosis not present

## 2020-11-07 DIAGNOSIS — R339 Retention of urine, unspecified: Secondary | ICD-10-CM

## 2020-11-07 LAB — BLADDER SCAN AMB NON-IMAGING: Scan Result: 0

## 2020-11-07 NOTE — Progress Notes (Signed)
11/07/2020 11:51 AM   [name] Aug 17, 1934 [id number]  Referring provider: Celene Squibb, MD [address] [name],  [name] [id number]  Chief Complaint  Patient presents with   Follow-up    PVR    HPI: [name] is a 86yo here for followup for BPH and urinary retention. He has been doing well since his Urolift. PVR 0cc. IPSS 2 QOL 0. Urine stream strong. No urinary hesitancy. No urinary urgency or urge incontinence. No other complaints today   PMH: Past Medical History:  Diagnosis Date   CAD (coronary artery disease)    Status post CABG 1999   Essential hypertension    Hyperlipidemia    Rectal cancer [name])     Surgical History: Past Surgical History:  Procedure Laterality Date   CATARACT EXTRACTION W/PHACO Left 06/25/2015   Procedure: CATARACT EXTRACTION PHACO AND INTRAOCULAR LENS PLACEMENT; CDE:  4.69;  Surgeon: James Che, MD;  Location: [facility];  Service: Ophthalmology;  Laterality: Left;   COLOSTOMY     CORONARY ARTERY BYPASS GRAFT  1999   CYSTOSCOPY WITH INSERTION OF UROLIFT N/A 08/09/2020   Procedure: CYSTOSCOPY WITH INSERTION OF UROLIFT;  Surgeon: James Gustin, MD;  Location: [facility];  Service: Urology;  Laterality: N/A;   RECTAL SURGERY      Home Medications:  Allergies as of 11/07/2020   No Known Allergies      Medication List        Accurate as of November 07, 2020 11:51 AM. If you have any questions, ask your nurse or doctor.          ALPRAZolam 0.5 MG tablet Commonly known as: XANAX Take 0.5 mg by mouth daily.   amLODipine 5 MG tablet Commonly known as: NORVASC Take 5 mg by mouth daily.   atorvastatin 10 MG tablet Commonly known as: LIPITOR Take 10 mg by mouth daily.   fish oil-omega-3 fatty acids 1000 MG capsule Take 3 g by mouth daily.   folic acid 657 MCG tablet Commonly known as: FOLVITE Take 400 mcg by mouth daily.   losartan 50 MG tablet Commonly known as: COZAAR Take 50 mg by mouth daily.   multivitamin  tablet Take 1 tablet by mouth daily.   tamsulosin 0.4 MG Caps capsule Commonly known as: FLOMAX Take 1 capsule (0.4 mg total) by mouth at bedtime.        Allergies: No Known Allergies  Family History: Family History  Problem Relation Age of Onset   Alcoholism Father    Diabetes Mellitus II Father     Social History:  reports that he has never smoked. His smokeless tobacco use includes chew. He reports that he does not drink alcohol and does not use drugs.  ROS: All other review of systems were reviewed and are negative except what is noted above in HPI  Physical Exam: BP 130/60   Pulse 78   Temp 98.4 F (36.9 C) (Oral)   Wt 125 lb 12.8 oz (57.1 kg)   BMI 19.13 kg/m   Constitutional:  Alert and oriented, No acute distress. HEENT: [place] AT, moist mucus membranes.  Trachea midline, no masses. Cardiovascular: No clubbing, cyanosis, or edema. Respiratory: Normal respiratory effort, no increased work of breathing. GI: Abdomen is soft, nontender, nondistended, no abdominal masses GU: No CVA tenderness.  Lymph: No cervical or inguinal lymphadenopathy. Skin: No rashes, bruises or suspicious lesions. Neurologic: Grossly intact, no focal deficits, moving all 4 extremities. Psychiatric: Normal mood and affect.  Laboratory Data: Lab Results  Component Value Date   WBC 5.9 08/07/2020   HGB 12.8 (L) 08/07/2020   HCT 39.4 08/07/2020   MCV 93.1 08/07/2020   PLT 178 08/07/2020    Lab Results  Component Value Date   CREATININE 1.01 08/07/2020    No results found for: PSA  No results found for: TESTOSTERONE  No results found for: HGBA1C  Urinalysis    Component Value Date/Time   COLORURINE YELLOW 03/31/2020 1614   APPEARANCEUR Clear 05/24/2020 0941   LABSPEC 1.015 03/31/2020 1614   PHURINE 5.0 03/31/2020 1614   GLUCOSEU Negative 05/24/2020 0941   HGBUR SMALL (A) 03/31/2020 1614   BILIRUBINUR Negative 05/24/2020 0941   KETONESUR NEGATIVE 03/31/2020 1614   PROTEINUR  Negative 05/24/2020 0941   PROTEINUR NEGATIVE 03/31/2020 1614   NITRITE Negative 05/24/2020 0941   NITRITE POSITIVE (A) 03/31/2020 1614   LEUKOCYTESUR Trace (A) 05/24/2020 0941   LEUKOCYTESUR LARGE (A) 03/31/2020 1614    Lab Results  Component Value Date   LABMICR See below: 05/24/2020   WBCUA 6-10 (A) 05/24/2020   LABEPIT None seen 05/24/2020   MUCUS Present 05/22/2020   BACTERIA Many (A) 05/24/2020    Pertinent Imaging:  No results found for this or any previous visit.  No results found for this or any previous visit.  No results found for this or any previous visit.  No results found for this or any previous visit.  No results found for this or any previous visit.  No results found for this or any previous visit.  No results found for this or any previous visit.  No results found for this or any previous visit.   Assessment & Plan:    1. Benign prostatic hyperplasia with urinary obstruction -RTC 6 months with PVR - BLADDER SCAN AMB NON-IMAGING - Urinalysis, Routine w reflex microscopic   No follow-ups on file.  James Bang, MD  [name] [name]

## 2020-11-18 ENCOUNTER — Encounter: Payer: Self-pay | Admitting: Urology

## 2020-11-18 NOTE — Patient Instructions (Signed)
Benign Prostatic Hyperplasia  Benign prostatic hyperplasia (BPH) is an enlarged prostate gland that is caused by the normal aging process and not by cancer. The prostate is a walnut-sized gland that is involved in the production of semen. It is located in front of the rectum and below the bladder. The bladder stores urine and the urethra is the tube that carries the urine out of the body. The prostate may get bigger asa man gets older. An enlarged prostate can press on the urethra. This can make it harder to pass urine. The build-up of urine in the bladder can cause infection. Back pressure and infection may progress to bladder damage and kidney (renal) failure. What are the causes? This condition is part of a normal aging process. However, not all men develop problems from this condition. If the prostate enlarges away from the urethra, urine flow will not be blocked. If it enlarges toward the urethra andcompresses it, there will be problems passing urine. What increases the risk? This condition is more likely to develop in men over the age of 50 years. What are the signs or symptoms? Symptoms of this condition include: Getting up often during the night to urinate. Needing to urinate frequently during the day. Difficulty starting urine flow. Decrease in size and strength of your urine stream. Leaking (dribbling) after urinating. Inability to pass urine. This needs immediate treatment. Inability to completely empty your bladder. Pain when you pass urine. This is more common if there is also an infection. Urinary tract infection (UTI). How is this diagnosed? This condition is diagnosed based on your medical history, a physical exam, and your symptoms. Tests will also be done, such as: A post-void bladder scan. This measures any amount of urine that may remain in your bladder after you finish urinating. A digital rectal exam. In a rectal exam, your health care provider checks your prostate by  putting a lubricated, gloved finger into your rectum to feel the back of your prostate gland. This exam detects the size of your gland and any abnormal lumps or growths. An exam of your urine (urinalysis). A prostate specific antigen (PSA) screening. This is a blood test used to screen for prostate cancer. An ultrasound. This test uses sound waves to electronically produce a picture of your prostate gland. Your health care provider may refer you to a specialist in kidney and prostate diseases (urologist). How is this treated? Once symptoms begin, your health care provider will monitor your condition (active surveillance or watchful waiting). Treatment for this condition will depend on the severity of your condition. Treatment may include: Observation and yearly exams. This may be the only treatment needed if your condition and symptoms are mild. Medicines to relieve your symptoms, including: Medicines to shrink the prostate. Medicines to relax the muscle of the prostate. Surgery in severe cases. Surgery may include: Prostatectomy. In this procedure, the prostate tissue is removed completely through an open incision or with a laparoscope or robotics. Transurethral resection of the prostate (TURP). In this procedure, a tool is inserted through the opening at the tip of the penis (urethra). It is used to cut away tissue of the inner core of the prostate. The pieces are removed through the same opening of the penis. This removes the blockage. Transurethral incision (TUIP). In this procedure, small cuts are made in the prostate. This lessens the prostate's pressure on the urethra. Transurethral microwave thermotherapy (TUMT). This procedure uses microwaves to create heat. The heat destroys and removes a small   amount of prostate tissue. Transurethral needle ablation (TUNA). This procedure uses radio frequencies to destroy and remove a small amount of prostate tissue. Interstitial laser coagulation (ILC).  This procedure uses a laser to destroy and remove a small amount of prostate tissue. Transurethral electrovaporization (TUVP). This procedure uses electrodes to destroy and remove a small amount of prostate tissue. Prostatic urethral lift. This procedure inserts an implant to push the lobes of the prostate away from the urethra. Follow these instructions at home: Take over-the-counter and prescription medicines only as told by your health care provider. Monitor your symptoms for any changes. Contact your health care provider with any changes. Avoid drinking large amounts of liquid before going to bed or out in public. Avoid or reduce how much caffeine or alcohol you drink. Give yourself time when you urinate. Keep all follow-up visits as told by your health care provider. This is important. Contact a health care provider if: You have unexplained back pain. Your symptoms do not get better with treatment. You develop side effects from the medicine you are taking. Your urine becomes very dark or has a bad smell. Your lower abdomen becomes distended and you have trouble passing your urine. Get help right away if: You have a fever or chills. You suddenly cannot urinate. You feel lightheaded, or very dizzy, or you faint. There are large amounts of blood or clots in the urine. Your urinary problems become hard to manage. You develop moderate to severe low back or flank pain. The flank is the side of your body between the ribs and the hip. These symptoms may represent a serious problem that is an emergency. Do not wait to see if the symptoms will go away. Get medical help right away. Call your local emergency services (911 in the [place] Do not drive yourself to the hospital. Summary Benign prostatic hyperplasia (BPH) is an enlarged prostate that is caused by the normal aging process and not by cancer. An enlarged prostate can press on the urethra. This can make it hard to pass urine. This  condition is part of a normal aging process and is more likely to develop in men over the age of 50 years. Get help right away if you suddenly cannot urinate. This information is not intended to replace advice given to you by your health care provider. Make sure you discuss any questions you have with your healthcare provider. Document Revised: 12/15/2019 Document Reviewed: 12/15/2019 Elsevier Patient Education  2022 Elsevier Inc.  

## 2020-12-19 DIAGNOSIS — E7849 Other hyperlipidemia: Secondary | ICD-10-CM | POA: Diagnosis not present

## 2020-12-19 DIAGNOSIS — I10 Essential (primary) hypertension: Secondary | ICD-10-CM | POA: Diagnosis not present

## 2021-01-28 DIAGNOSIS — Z23 Encounter for immunization: Secondary | ICD-10-CM | POA: Diagnosis not present

## 2021-02-25 DIAGNOSIS — H40023 Open angle with borderline findings, high risk, bilateral: Secondary | ICD-10-CM | POA: Diagnosis not present

## 2021-02-25 DIAGNOSIS — H5213 Myopia, bilateral: Secondary | ICD-10-CM | POA: Diagnosis not present

## 2021-03-01 DIAGNOSIS — I10 Essential (primary) hypertension: Secondary | ICD-10-CM | POA: Diagnosis not present

## 2021-03-06 DIAGNOSIS — I4891 Unspecified atrial fibrillation: Secondary | ICD-10-CM | POA: Diagnosis not present

## 2021-03-06 DIAGNOSIS — R42 Dizziness and giddiness: Secondary | ICD-10-CM | POA: Diagnosis not present

## 2021-03-06 DIAGNOSIS — Z0001 Encounter for general adult medical examination with abnormal findings: Secondary | ICD-10-CM | POA: Diagnosis not present

## 2021-03-06 DIAGNOSIS — N400 Enlarged prostate without lower urinary tract symptoms: Secondary | ICD-10-CM | POA: Diagnosis not present

## 2021-03-06 DIAGNOSIS — Z0000 Encounter for general adult medical examination without abnormal findings: Secondary | ICD-10-CM | POA: Diagnosis not present

## 2021-03-06 DIAGNOSIS — I10 Essential (primary) hypertension: Secondary | ICD-10-CM | POA: Diagnosis not present

## 2021-03-06 DIAGNOSIS — E785 Hyperlipidemia, unspecified: Secondary | ICD-10-CM | POA: Diagnosis not present

## 2021-03-20 DIAGNOSIS — E782 Mixed hyperlipidemia: Secondary | ICD-10-CM | POA: Diagnosis not present

## 2021-03-20 DIAGNOSIS — I10 Essential (primary) hypertension: Secondary | ICD-10-CM | POA: Diagnosis not present

## 2021-05-10 ENCOUNTER — Ambulatory Visit: Payer: Medicare Other | Admitting: Urology

## 2021-05-10 DIAGNOSIS — R339 Retention of urine, unspecified: Secondary | ICD-10-CM

## 2021-05-10 DIAGNOSIS — N138 Other obstructive and reflux uropathy: Secondary | ICD-10-CM

## 2021-08-29 DIAGNOSIS — I10 Essential (primary) hypertension: Secondary | ICD-10-CM | POA: Diagnosis not present

## 2021-08-29 DIAGNOSIS — E785 Hyperlipidemia, unspecified: Secondary | ICD-10-CM | POA: Diagnosis not present

## 2021-09-03 DIAGNOSIS — I4891 Unspecified atrial fibrillation: Secondary | ICD-10-CM | POA: Diagnosis not present

## 2021-09-03 DIAGNOSIS — L600 Ingrowing nail: Secondary | ICD-10-CM | POA: Diagnosis not present

## 2021-09-03 DIAGNOSIS — E785 Hyperlipidemia, unspecified: Secondary | ICD-10-CM | POA: Diagnosis not present

## 2021-09-03 DIAGNOSIS — N400 Enlarged prostate without lower urinary tract symptoms: Secondary | ICD-10-CM | POA: Diagnosis not present

## 2021-09-03 DIAGNOSIS — D649 Anemia, unspecified: Secondary | ICD-10-CM | POA: Diagnosis not present

## 2021-09-03 DIAGNOSIS — R42 Dizziness and giddiness: Secondary | ICD-10-CM | POA: Diagnosis not present

## 2021-09-03 DIAGNOSIS — I10 Essential (primary) hypertension: Secondary | ICD-10-CM | POA: Diagnosis not present

## 2021-09-19 ENCOUNTER — Other Ambulatory Visit: Payer: Self-pay | Admitting: Podiatry

## 2021-09-19 DIAGNOSIS — L97529 Non-pressure chronic ulcer of other part of left foot with unspecified severity: Secondary | ICD-10-CM | POA: Diagnosis not present

## 2021-09-19 DIAGNOSIS — I739 Peripheral vascular disease, unspecified: Secondary | ICD-10-CM | POA: Diagnosis not present

## 2021-09-24 ENCOUNTER — Ambulatory Visit (HOSPITAL_COMMUNITY)
Admission: RE | Admit: 2021-09-24 | Discharge: 2021-09-24 | Disposition: A | Discharge status: Home / Self Care | Payer: Medicare Other | Source: Ambulatory Visit | Attending: Podiatry | Admitting: Podiatry

## 2021-09-24 DIAGNOSIS — I739 Peripheral vascular disease, unspecified: Secondary | ICD-10-CM | POA: Diagnosis not present

## 2021-09-24 DIAGNOSIS — L97529 Non-pressure chronic ulcer of other part of left foot with unspecified severity: Secondary | ICD-10-CM | POA: Diagnosis not present

## 2021-10-09 ENCOUNTER — Encounter: Payer: Medicare Other | Admitting: Vascular Surgery

## 2021-10-23 ENCOUNTER — Encounter: Payer: Self-pay | Admitting: Vascular Surgery

## 2021-10-23 ENCOUNTER — Ambulatory Visit (INDEPENDENT_AMBULATORY_CARE_PROVIDER_SITE_OTHER): Payer: Medicare Other | Admitting: Vascular Surgery

## 2021-10-23 VITALS — BP 141/70 | HR 86 | Temp 99.2°F | Ht 68.0 in | Wt 123.0 lb

## 2021-10-23 DIAGNOSIS — I70222 Atherosclerosis of native arteries of extremities with rest pain, left leg: Secondary | ICD-10-CM | POA: Diagnosis not present
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
## 2021-11-04 ENCOUNTER — Other Ambulatory Visit: Payer: Self-pay

## 2021-11-04 DIAGNOSIS — L97529 Non-pressure chronic ulcer of other part of left foot with unspecified severity: Secondary | ICD-10-CM

## 2021-11-04 DIAGNOSIS — I70222 Atherosclerosis of native arteries of extremities with rest pain, left leg: Secondary | ICD-10-CM

## 2021-11-07 ENCOUNTER — Ambulatory Visit (HOSPITAL_COMMUNITY)
Admission: RE | Admit: 2021-11-07 | Discharge: 2021-11-07 | Disposition: A | Discharge status: Home / Self Care | Payer: Medicare Other | Attending: Vascular Surgery | Admitting: Vascular Surgery

## 2021-11-07 ENCOUNTER — Other Ambulatory Visit: Payer: Self-pay

## 2021-11-07 ENCOUNTER — Ambulatory Visit (HOSPITAL_BASED_OUTPATIENT_CLINIC_OR_DEPARTMENT_OTHER)
Admission: RE | Admit: 2021-11-07 | Discharge: 2021-11-07 | Disposition: A | Discharge status: Home / Self Care | Payer: Medicare Other | Source: Home / Self Care | Attending: Vascular Surgery | Admitting: Vascular Surgery

## 2021-11-07 ENCOUNTER — Encounter (HOSPITAL_COMMUNITY)
Admission: RE | Disposition: A | Discharge status: Home / Self Care | Payer: Self-pay | Source: Home / Self Care | Attending: Vascular Surgery

## 2021-11-07 DIAGNOSIS — Z951 Presence of aortocoronary bypass graft: Secondary | ICD-10-CM | POA: Insufficient documentation

## 2021-11-07 DIAGNOSIS — F17290 Nicotine dependence, other tobacco product, uncomplicated: Secondary | ICD-10-CM | POA: Insufficient documentation

## 2021-11-07 DIAGNOSIS — K4090 Unilateral inguinal hernia, without obstruction or gangrene, not specified as recurrent: Secondary | ICD-10-CM | POA: Insufficient documentation

## 2021-11-07 DIAGNOSIS — Z933 Colostomy status: Secondary | ICD-10-CM | POA: Insufficient documentation

## 2021-11-07 DIAGNOSIS — Z85048 Personal history of other malignant neoplasm of rectum, rectosigmoid junction, and anus: Secondary | ICD-10-CM | POA: Diagnosis not present

## 2021-11-07 DIAGNOSIS — L97529 Non-pressure chronic ulcer of other part of left foot with unspecified severity: Secondary | ICD-10-CM | POA: Diagnosis not present

## 2021-11-07 DIAGNOSIS — I70245 Atherosclerosis of native arteries of left leg with ulceration of other part of foot: Secondary | ICD-10-CM | POA: Insufficient documentation

## 2021-11-07 DIAGNOSIS — I2510 Atherosclerotic heart disease of native coronary artery without angina pectoris: Secondary | ICD-10-CM | POA: Insufficient documentation

## 2021-11-07 DIAGNOSIS — Z01810 Encounter for preprocedural cardiovascular examination: Secondary | ICD-10-CM

## 2021-11-07 DIAGNOSIS — I998 Other disorder of circulatory system: Secondary | ICD-10-CM | POA: Diagnosis not present

## 2021-11-07 DIAGNOSIS — I70222 Atherosclerosis of native arteries of extremities with rest pain, left leg: Secondary | ICD-10-CM

## 2021-11-07 HISTORY — PX: ABDOMINAL AORTOGRAM W/LOWER EXTREMITY: CATH118223

## 2021-11-07 LAB — POCT I-STAT, CHEM 8
BUN: 16 mg/dL (ref 8–23)
Calcium, Ion: 1.32 mmol/L (ref 1.15–1.40)
Chloride: 100 mmol/L (ref 98–111)
Creatinine, Ser: 1 mg/dL (ref 0.61–1.24)
Glucose, Bld: 97 mg/dL (ref 70–99)
HCT: 39 % (ref 39.0–52.0)
Hemoglobin: 13.3 g/dL (ref 13.0–17.0)
Potassium: 4.1 mmol/L (ref 3.5–5.1)
Sodium: 138 mmol/L (ref 135–145)
TCO2: 27 mmol/L (ref 22–32)

## 2021-11-07 SURGERY — ABDOMINAL AORTOGRAM W/LOWER EXTREMITY
Anesthesia: LOCAL

## 2021-11-07 MED ORDER — SODIUM CHLORIDE 0.9% FLUSH: 3.0000 mL | INTRAVENOUS | Status: DC | PRN

## 2021-11-07 MED ORDER — FENTANYL CITRATE (PF) 100 MCG/2ML IJ SOLN
INTRAMUSCULAR | Status: DC | PRN
  Administered 2021-11-07: 25 ug via INTRAVENOUS

## 2021-11-07 MED ORDER — LIDOCAINE HCL (PF) 1 % IJ SOLN
INTRAMUSCULAR | Status: AC
  Filled 2021-11-07: qty 30

## 2021-11-07 MED ORDER — LIDOCAINE HCL (PF) 1 % IJ SOLN
INTRAMUSCULAR | Status: DC | PRN
  Administered 2021-11-07: 15 mL via INTRADERMAL

## 2021-11-07 MED ORDER — ONDANSETRON HCL 4 MG/2ML IJ SOLN: 4.0000 mg | Freq: Four times a day (QID) | INTRAMUSCULAR | Status: DC | PRN

## 2021-11-07 MED ORDER — MIDAZOLAM HCL 2 MG/2ML IJ SOLN
INTRAMUSCULAR | Status: AC
  Filled 2021-11-07: qty 2

## 2021-11-07 MED ORDER — SODIUM CHLORIDE 0.9 % IV SOLN: INTRAVENOUS | Status: AC

## 2021-11-07 MED ORDER — HYDRALAZINE HCL 20 MG/ML IJ SOLN: 5.0000 mg | INTRAMUSCULAR | Status: DC | PRN

## 2021-11-07 MED ORDER — HEPARIN (PORCINE) IN NACL 1000-0.9 UT/500ML-% IV SOLN
INTRAVENOUS | Status: DC | PRN
  Administered 2021-11-07 (×2): 500 mL

## 2021-11-07 MED ORDER — ACETAMINOPHEN 325 MG PO TABS: 650.0000 mg | ORAL_TABLET | ORAL | Status: DC | PRN

## 2021-11-07 MED ORDER — MIDAZOLAM HCL 2 MG/2ML IJ SOLN
INTRAMUSCULAR | Status: DC | PRN
  Administered 2021-11-07: 1 mg via INTRAVENOUS

## 2021-11-07 MED ORDER — FENTANYL CITRATE (PF) 100 MCG/2ML IJ SOLN
INTRAMUSCULAR | Status: AC
  Filled 2021-11-07: qty 2

## 2021-11-07 MED ORDER — LABETALOL HCL 5 MG/ML IV SOLN: 10.0000 mg | INTRAVENOUS | Status: DC | PRN

## 2021-11-07 MED ORDER — SODIUM CHLORIDE 0.9 % IV SOLN: 250.0000 mL | INTRAVENOUS | Status: DC | PRN

## 2021-11-07 MED ORDER — HEPARIN (PORCINE) IN NACL 1000-0.9 UT/500ML-% IV SOLN
INTRAVENOUS | Status: AC
  Filled 2021-11-07: qty 1000

## 2021-11-07 MED ORDER — SODIUM CHLORIDE 0.9% FLUSH: 3.0000 mL | Freq: Two times a day (BID) | INTRAVENOUS | Status: DC

## 2021-11-07 MED ORDER — SODIUM CHLORIDE 0.9 % IV SOLN: INTRAVENOUS | Status: DC

## 2021-11-07 MED ORDER — IODIXANOL 320 MG/ML IV SOLN
INTRAVENOUS | Status: DC | PRN
  Administered 2021-11-07: 97 mL

## 2021-11-07 SURGICAL SUPPLY — 10 items
CATH OMNI FLUSH 5F 65CM (CATHETERS) ×1 IMPLANT
KIT MICROPUNCTURE NIT STIFF (SHEATH) ×1 IMPLANT
KIT PV (KITS) ×2 IMPLANT
SHEATH PINNACLE 5F 10CM (SHEATH) ×1 IMPLANT
SHEATH PROBE COVER 6X72 (BAG) ×2 IMPLANT
SYR MEDRAD MARK V 150ML (SYRINGE) ×1 IMPLANT
TRANSDUCER W/STOPCOCK (MISCELLANEOUS) ×2 IMPLANT
TRAY PV CATH (CUSTOM PROCEDURE TRAY) ×2 IMPLANT
TUBING INJECTOR 48 (MISCELLANEOUS) ×1 IMPLANT
WIRE BENTSON .035X145CM (WIRE) ×1 IMPLANT

## 2021-11-07 NOTE — Op Note (Signed)
    Patient name: [name] MRN: [id number] DOB: Dec 31, 1934 Sex: male  11/07/2021 Pre-operative Diagnosis: Critical limb ischemia of the left lower extremity with tissue loss Post-operative diagnosis:  Same Surgeon:  [name], MD Procedure Performed: 1.  Ultrasound-guided access right common femoral artery 2.  Aortogram with catheter selection of aorta 3.  Bilateral lower extremity arteriogram with runoff 4.  19 minutes of monitored moderate conscious sedation time  Indications: Patient is a 87 year old male that has tissue loss between the left fourth and fifth toes and was evaluated by Dr. Donnetta Hutching for arterial insufficiency in [place].  He presents today for aortogram with lower extremity arteriogram with a focus on the left leg after risks benefits discussed.  Findings:   Aortogram showed the infrarenal aorta and both iliac systems widely patent.  On the left, which is the side of interest he has high-grade calcified common femoral artery stenosis.  There is a flush SFA occlusion with a patent profunda.  He reconstitutes an [place] of above and below-knee popliteal artery but the tibial trifurcation distal to this is occluded.  Dominant runoff is in the posterior tibial that reconstitutes in the mid to proximal calf but he also has peroneal that reconstitutes as well.  Right lower extremity arteriogram also shows significant common femoral disease with a proximal SFA stump that is patent and then a long segment SFA occlusion with reconstitution of the popliteal and again occlusion of the trifurcation with what looks like peroneal and PT runoff.   Procedure:  The patient was identified in the holding area and taken to room 8.  The patient was then placed supine on the table and prepped and draped in the usual sterile fashion.  A time out was called.  Ultrasound was used to evaluate the right common femoral artery.  It was patent .  A digital ultrasound image was acquired.  A  micropuncture needle was used to access the right common femoral artery under ultrasound guidance.  An 018 wire was advanced without resistance and a micropuncture sheath was placed.  The 018 wire was removed and a benson wire was placed.  The micropuncture sheath was exchanged for a 5 french sheath.  An omniflush catheter was advanced over the wire to the level of L-1.  An abdominal angiogram was obtained.  Next the catheter was pulled down and bilateral lower extremity runoff was obtained.  Pertinent findings are noted above  Plan: I reviewed all of the images with the patient.  Discussed he has multilevel disease on the left where he has tissue loss.  I have recommended a left common femoral endarterectomy with a left common femoral to posterior tibial bypass.  We will get vein mapping today.  He wants to be scheduled in several weeks so he can think about his options.   [name], MD [facility] [place] Office: [phone]

## 2021-11-07 NOTE — Progress Notes (Signed)
Bilateral lower extremity vein mapping completed. Refer to "CV Proc" under chart review to view preliminary results.  11/07/2021 4:27 PM Kelby Aline., MHA, RVT, RDCS, RDMS

## 2021-11-07 NOTE — Progress Notes (Signed)
Report and care received from [facility]. Patient resting comfortably. 55F sheath to Rt Femoral Artery is soft with no active bleeding or hematoma noted. Lt DP and RT PT by doppler.

## 2021-11-07 NOTE — Progress Notes (Signed)
Patient  ambulated and has voided several times post procedure. Right groin remains unremarkable.

## 2021-11-07 NOTE — H&P (Signed)
History and Physical Interval Note:  11/07/2021 10:32 AM  [name]  has presented today for surgery, with the diagnosis of left foot ulcer - ischemia.  The various methods of treatment have been discussed with the patient and family. After consideration of risks, benefits and other options for treatment, the patient has consented to  Procedure(s): ABDOMINAL AORTOGRAM W/LOWER EXTREMITY (N/A) as a surgical intervention.  The patient's history has been reviewed, patient examined, no change in status, stable for surgery.  I have reviewed the patient's chart and labs.  Questions were answered to the patient's satisfaction.     [name]  [facility] [place]   Patient name: [name]         MRN: [id number]        DOB: [birth year]          Sex: male   REASON FOR CONSULT: Evaluation critical limb ischemia left leg   HPI: [name] is a 87 y.o. male, who is here today for evaluation.  He is very pleasant animated gentleman with ulceration between his fourth and fifth toe.  He relates his difficulty to COVID shot.  He does have a long history of arterial disease.  He had coronary artery bypass grafting in 1999.  He had harvest of vein from his right leg.  He has had a several month history of ulceration on the medial aspect of his fifth toe and lateral aspect of his fourth toe of his left foot.  This has been slow to heal.  He does not have claudication symptoms.  He did have traumatic loss of his right fourth and fifth toe from hunting accident in 1953.  He has end colostomy for rectal cancer.  Also has history of left inguinal hernia.       Past Medical History:  Diagnosis Date   CAD (coronary artery disease)      Status post CABG 1999   Essential hypertension     Hyperlipidemia     Rectal cancer (HCC)             Family History  Problem Relation Age of Onset   Alcoholism Father     Diabetes Mellitus II Father        SOCIAL  HISTORY: Social History         Socioeconomic History   Marital status: Married      Spouse name: Not on file   Number of children: Not on file   Years of education: Not on file   Highest education level: Not on file  Occupational History   Occupation: Retired  Tobacco Use   Smoking status: Never   Smokeless tobacco: Current      Types: Chew  Substance and Sexual Activity   Alcohol use: No   Drug use: No   Sexual activity: Not on file  Other Topics Concern   Not on file  Social History Narrative    Exercises regularly    Social Determinants of Health    Financial Resource Strain: Not on file  Food Insecurity: Not on file  Transportation Needs: Not on file  Physical Activity: Not on file  Stress: Not on file  Social Connections: Not on file  Intimate Partner Violence: Not on file      No Known Allergies         Current Outpatient Medications  Medication Sig Dispense Refill   amLODipine (NORVASC) 5 MG tablet Take 5 mg by mouth  daily.       atorvastatin (LIPITOR) 10 MG tablet Take 10 mg by mouth daily.       fish oil-omega-3 fatty acids 1000 MG capsule Take 3 g by mouth daily.       folic acid (FOLVITE) 592 MCG tablet Take 400 mcg by mouth daily.       losartan (COZAAR) 50 MG tablet Take 50 mg by mouth daily.       Multiple Vitamin (MULTIVITAMIN) tablet Take 1 tablet by mouth daily.       ALPRAZolam (XANAX) 0.5 MG tablet Take 0.5 mg by mouth daily. (Patient not taking: Reported on 11/07/2020)       tamsulosin (FLOMAX) 0.4 MG CAPS capsule Take 1 capsule (0.4 mg total) by mouth at bedtime. (Patient not taking: Reported on 11/07/2020) 90 capsule 3    No current facility-administered medications for this visit.      PHYSICAL EXAM:    Vitals:    10/23/21 1350  BP: (!) 141/70  Pulse: 86  Temp: 99.2 F (37.3 C)  TempSrc: Temporal  SpO2: 96%  Weight: 123 lb (55.8 kg)  Height: '5\' 8"'$  (1.727 m)      GENERAL: The patient is a well-nourished male, in no acute  distress. The vital signs are documented above. CARDIOVASCULAR: 2+ radial pulses bilaterally.  2+ right femoral pulse and absent left femoral pulse.  I do not palpate popliteal or distal pulses. Abdominal exam reveals left lower quadrant and colostomy PULMONARY: There is good air exchange  MUSCULOSKELETAL: There are no major deformities or cyanosis.  Surgically absent right fourth and fifth toe NEUROLOGIC: No focal weakness or paresthesias are detected. SKIN: There are no ulcers or rashes noted. PSYCHIATRIC: The patient has a normal affect.   DATA:  Noninvasive studies from [facility] reviewed.  This reveals ankle arm index of 0.63 on the right and 0.46 on the left.  Toe brachial index is 0.53 on the right and 0.28 on the left   MEDICAL ISSUES: Critical limb ischemia right foot with nonhealing ulceration of the fourth and fifth toes.  I do not palpate a femoral pulse on the left.  I have recommended arteriography for further evaluation.  I did explain that there is a high likelihood that he would have a possibility for angioplasty for treatment of his ischemia.  This will be coordinated at [facility] at his earliest [place]. Early, MD [facility] of [facility] Tel [phone] Pager [phone]

## 2021-11-07 NOTE — Progress Notes (Signed)
Site area: Right groin a 5 french arterial sheath removed Liliane Shi RN 2 H  Site Prior to Removal:  Level 0  Pressure Applied For 20 MINUTES    Bedrest Beginning at 1230 pm X 4 hours  Manual:   Yes.    Patient Status During Pull:  stable  Post Pull Groin Site:  Level 0  Post Pull Instructions Given:  Yes.    Post Pull Pulses Present:  Yes.    Dressing Applied:  Yes.    Comments:

## 2021-11-08 ENCOUNTER — Encounter (HOSPITAL_COMMUNITY): Payer: Self-pay | Admitting: Vascular Surgery

## 2021-11-13 ENCOUNTER — Other Ambulatory Visit: Payer: Self-pay

## 2021-11-13 DIAGNOSIS — I70222 Atherosclerosis of native arteries of extremities with rest pain, left leg: Secondary | ICD-10-CM

## 2021-11-13 DIAGNOSIS — Z419 Encounter for procedure for purposes other than remedying health state, unspecified: Secondary | ICD-10-CM

## 2021-11-15 NOTE — Progress Notes (Signed)
Surgical Instructions    Your procedure is scheduled on Wednesday, August 2nd, 2023.   Report to [facility] Main Entrance "A" at 06:30 A.M., then check in with the Admitting office.  Call this number if you have problems the morning of surgery:  [phone]   If you have any questions prior to your surgery date call [phone]: Open Monday-Friday 8am-4pm    Remember:  Do not eat or drink after midnight the night before your surgery    Take these medicines the morning of surgery with A SIP OF WATER:   amLODipine (NORVASC) atorvastatin (LIPITOR) aspirin EC   Naphazoline-Pheniramine (OPCON-A) - as needed  As of today, STOP taking any Aspirin (unless otherwise instructed by your surgeon) Aleve, Naproxen, Ibuprofen, Motrin, Advil, Goody's, BC's, all herbal medications, fish oil, and all vitamins.    The day of surgery:          Do not wear jewelry  Do not wear lotions, powders, colognes, or deodorant. Men may shave face and neck. Do not bring valuables to the hospital.   [facility] is not responsible for [facility] belongings or valuables. .   Do NOT Smoke (Tobacco/Vaping)  24 hours prior to your procedure  If you use a CPAP at night, you may bring your mask for your overnight stay.   Contacts, glasses, hearing aids, dentures or partials may not be worn into surgery, please bring cases for these belongings   For patients admitted to the hospital, discharge time will be determined by your treatment team.   Patients discharged the day of surgery will not be allowed to drive home, and someone needs to stay with them for 24 hours.   SURGICAL WAITING ROOM VISITATION Patients having surgery or a procedure may have no more than 2 support people in the waiting area - these visitors may rotate.   Children under the age of 10 must have an adult with them who is not the patient. If the patient needs to stay at the hospital during part of their recovery, the visitor guidelines for  inpatient rooms apply. Pre-op nurse will coordinate an appropriate time for 1 support person to accompany patient in pre-op.  This support person may not rotate.   Please refer to the [facility] website for the visitor guidelines for Inpatients (after your surgery is over and you are in a regular room).    Special instructions:    Oral Hygiene is also important to reduce your risk of infection.  Remember - BRUSH YOUR TEETH THE MORNING OF SURGERY WITH YOUR REGULAR TOOTHPASTE   [place]- Preparing For Surgery  Before surgery, you can play an important role. Because skin is not sterile, your skin needs to be as free of germs as possible. You can reduce the number of germs on your skin by washing with CHG (chlorahexidine gluconate) Soap before surgery.  CHG is an antiseptic cleaner which kills germs and bonds with the skin to continue killing germs even after washing.     Please do not use if you have an allergy to CHG or antibacterial soaps. If your skin becomes reddened/irritated stop using the CHG.  Do not shave (including legs and underarms) for at least 48 hours prior to first CHG shower. It is OK to shave your face.  Please follow these instructions carefully.     Shower the NIGHT BEFORE SURGERY and the MORNING OF SURGERY with CHG Soap.   If you chose to wash your hair, wash your hair first  as usual with your normal shampoo. After you shampoo, rinse your hair and body thoroughly to remove the shampoo.  Then [employer] and genitals (private parts) with your normal soap and rinse thoroughly to remove soap.  After that Use CHG Soap as you would any other liquid soap. You can apply CHG directly to the skin and wash gently with a scrungie or a clean washcloth.   Apply the CHG Soap to your body ONLY FROM THE NECK DOWN.  Do not use on open wounds or open sores. Avoid contact with your eyes, ears, mouth and genitals (private parts). Wash Face and genitals (private parts)  with your normal  soap.   Wash thoroughly, paying special attention to the area where your surgery will be performed.  Thoroughly rinse your body with warm water from the neck down.  DO NOT shower/wash with your normal soap after using and rinsing off the CHG Soap.  Pat yourself dry with a CLEAN TOWEL.  Wear CLEAN PAJAMAS to bed the night before surgery  Place CLEAN SHEETS on your bed the night before your surgery  DO NOT SLEEP WITH PETS.   Day of Surgery:  Take a shower with CHG soap. Wear Clean/Comfortable clothing the morning of surgery Do not apply any deodorants/lotions.   Remember to brush your teeth WITH YOUR REGULAR TOOTHPASTE.    If you received a COVID test during your pre-op visit, it is requested that you wear a mask when out in public, stay away from anyone that may not be feeling well, and notify your surgeon if you develop symptoms. If you have been in contact with anyone that has tested positive in the last 10 days, please notify your surgeon.    Please read over the following fact sheets that you were given.

## 2021-11-18 ENCOUNTER — Encounter (HOSPITAL_COMMUNITY)
Admission: RE | Admit: 2021-11-18 | Discharge: 2021-11-18 | Disposition: A | Discharge status: Home / Self Care | Payer: Medicare Other | Source: Ambulatory Visit | Attending: Vascular Surgery | Admitting: Vascular Surgery

## 2021-11-18 ENCOUNTER — Encounter (HOSPITAL_COMMUNITY): Payer: Self-pay

## 2021-11-18 ENCOUNTER — Other Ambulatory Visit: Payer: Self-pay

## 2021-11-18 VITALS — BP 137/79 | HR 72 | Temp 98.0°F | Resp 18 | Ht 68.0 in | Wt 118.5 lb

## 2021-11-18 DIAGNOSIS — Z419 Encounter for procedure for purposes other than remedying health state, unspecified: Secondary | ICD-10-CM

## 2021-11-18 DIAGNOSIS — I739 Peripheral vascular disease, unspecified: Secondary | ICD-10-CM | POA: Diagnosis present

## 2021-11-18 DIAGNOSIS — I70245 Atherosclerosis of native arteries of left leg with ulceration of other part of foot: Secondary | ICD-10-CM | POA: Diagnosis not present

## 2021-11-18 DIAGNOSIS — I70222 Atherosclerosis of native arteries of extremities with rest pain, left leg: Secondary | ICD-10-CM

## 2021-11-18 DIAGNOSIS — Z89421 Acquired absence of other right toe(s): Secondary | ICD-10-CM | POA: Diagnosis not present

## 2021-11-18 DIAGNOSIS — Z933 Colostomy status: Secondary | ICD-10-CM | POA: Diagnosis not present

## 2021-11-18 DIAGNOSIS — E785 Hyperlipidemia, unspecified: Secondary | ICD-10-CM | POA: Diagnosis present

## 2021-11-18 DIAGNOSIS — I10 Essential (primary) hypertension: Secondary | ICD-10-CM | POA: Diagnosis not present

## 2021-11-18 DIAGNOSIS — Z833 Family history of diabetes mellitus: Secondary | ICD-10-CM | POA: Diagnosis not present

## 2021-11-18 DIAGNOSIS — Z01818 Encounter for other preprocedural examination: Secondary | ICD-10-CM

## 2021-11-18 DIAGNOSIS — Z85048 Personal history of other malignant neoplasm of rectum, rectosigmoid junction, and anus: Secondary | ICD-10-CM | POA: Diagnosis not present

## 2021-11-18 DIAGNOSIS — I2510 Atherosclerotic heart disease of native coronary artery without angina pectoris: Secondary | ICD-10-CM | POA: Diagnosis not present

## 2021-11-18 DIAGNOSIS — R338 Other retention of urine: Secondary | ICD-10-CM | POA: Diagnosis present

## 2021-11-18 DIAGNOSIS — Z951 Presence of aortocoronary bypass graft: Secondary | ICD-10-CM | POA: Diagnosis not present

## 2021-11-18 DIAGNOSIS — Z79899 Other long term (current) drug therapy: Secondary | ICD-10-CM | POA: Diagnosis not present

## 2021-11-18 DIAGNOSIS — Z01812 Encounter for preprocedural laboratory examination: Secondary | ICD-10-CM | POA: Insufficient documentation

## 2021-11-18 DIAGNOSIS — N401 Enlarged prostate with lower urinary tract symptoms: Secondary | ICD-10-CM | POA: Diagnosis present

## 2021-11-18 DIAGNOSIS — L97529 Non-pressure chronic ulcer of other part of left foot with unspecified severity: Secondary | ICD-10-CM | POA: Diagnosis present

## 2021-11-18 LAB — CBC
HCT: 42 % (ref 39.0–52.0)
Hemoglobin: 14 g/dL (ref 13.0–17.0)
MCH: 30.8 pg (ref 26.0–34.0)
MCHC: 33.3 g/dL (ref 30.0–36.0)
MCV: 92.5 fL (ref 80.0–100.0)
Platelets: 186 10*3/uL (ref 150–400)
RBC: 4.54 MIL/uL (ref 4.22–5.81)
RDW: 12.8 % (ref 11.5–15.5)
WBC: 7.7 10*3/uL (ref 4.0–10.5)
nRBC: 0 % (ref 0.0–0.2)

## 2021-11-18 LAB — URINALYSIS, ROUTINE W REFLEX MICROSCOPIC
Bilirubin Urine: NEGATIVE
Glucose, UA: NEGATIVE mg/dL
Hgb urine dipstick: NEGATIVE
Ketones, ur: NEGATIVE mg/dL
Leukocytes,Ua: NEGATIVE
Nitrite: NEGATIVE
Protein, ur: NEGATIVE mg/dL
Specific Gravity, Urine: 1.013 (ref 1.005–1.030)
pH: 6 (ref 5.0–8.0)

## 2021-11-18 LAB — COMPREHENSIVE METABOLIC PANEL
ALT: 19 U/L (ref 0–44)
AST: 25 U/L (ref 15–41)
Albumin: 3.9 g/dL (ref 3.5–5.0)
Alkaline Phosphatase: 58 U/L (ref 38–126)
Anion gap: 8 (ref 5–15)
BUN: 14 mg/dL (ref 8–23)
CO2: 28 mmol/L (ref 22–32)
Calcium: 10 mg/dL (ref 8.9–10.3)
Chloride: 104 mmol/L (ref 98–111)
Creatinine, Ser: 1.08 mg/dL (ref 0.61–1.24)
GFR, Estimated: >60 mL/min (ref >60)
Glucose, Bld: 110 mg/dL — ABNORMAL HIGH (ref 70–99)
Potassium: 4 mmol/L (ref 3.5–5.1)
Sodium: 140 mmol/L (ref 135–145)
Total Bilirubin: 0.9 mg/dL (ref 0.3–1.2)
Total Protein: 7.7 g/dL (ref 6.5–8.1)

## 2021-11-18 LAB — PROTIME-INR
INR: 1.1 (ref 0.8–1.2)
Prothrombin Time: 14.4 seconds (ref 11.4–15.2)

## 2021-11-18 LAB — APTT: aPTT: 35 seconds (ref 24–36)

## 2021-11-18 LAB — SURGICAL PCR SCREEN
MRSA, PCR: POSITIVE — AB
Staphylococcus aureus: POSITIVE — AB

## 2021-11-18 NOTE — Progress Notes (Signed)
Surgical PCR result MRSA + and Staph +  Voicemail left for Dr. Ainsley Spinner scheduler, Zigmund Daniel

## 2021-11-18 NOTE — Progress Notes (Signed)
PCP - Allyn Kenner   Cardiologist - Theotis Barrio  PPM/ICD - Denies   Chest x-ray - NI EKG - 11/07/21 Stress Test - 12/23/00 ECHO - 05/10/19 Cardiac Cath - Denies  Sleep Study - Denies  /dm - Denies  Blood Thinner Instructions: NI Aspirin Instructions: Takes aspirin  ERAS Protcol -NI  Anesthesia review: No  Patient denies shortness of breath, fever, cough and chest pain at PAT appointment   All instructions explained to the patient, with a verbal understanding of the material. Patient agrees to go over the instructions while at home for a better understanding. . The opportunity to ask questions was provided.

## 2021-11-19 NOTE — Anesthesia Preprocedure Evaluation (Signed)
Anesthesia Evaluation  Patient identified by MRN, date of birth, ID band Patient awake    Reviewed: Allergy & Precautions, NPO status , Patient's Chart, lab work & pertinent test results  Airway Mallampati: II  TM Distance: >3 FB Neck ROM: Full    Dental  (+) Dental Advisory Given, Edentulous Lower, Edentulous Upper   Pulmonary neg pulmonary ROS,    Pulmonary exam normal breath sounds clear to auscultation       Cardiovascular hypertension, Pt. on medications + CAD, + CABG (1999) and + Peripheral Vascular Disease (Critical limb ischemia of left lower extremity with tissue loss)  Normal cardiovascular exam Rhythm:Regular Rate:Normal     Neuro/Psych negative neurological ROS  negative psych ROS   GI/Hepatic Neg liver ROS, Rectal ca   Endo/Other  negative endocrine ROS  Renal/GU negative Renal ROS     Musculoskeletal negative musculoskeletal ROS (+)   Abdominal   Peds  Hematology negative hematology ROS (+)   Anesthesia Other Findings   Reproductive/Obstetrics                            Anesthesia Physical Anesthesia Plan  ASA: 3  Anesthesia Plan: General   Post-op Pain Management: Tylenol PO (pre-op)*   Induction: Intravenous  PONV Risk Score and Plan: 2 and Dexamethasone, Ondansetron and Treatment may vary due to age or medical condition  Airway Management Planned: Oral ETT  Additional Equipment: Arterial line  Intra-op Plan:   Post-operative Plan: Extubation in OR  Informed Consent: I have reviewed the patients History and Physical, chart, labs and discussed the procedure including the risks, benefits and alternatives for the proposed anesthesia with the patient or authorized representative who has indicated his/her understanding and acceptance.     Dental advisory given  Plan Discussed with: CRNA  Anesthesia Plan Comments: (2nd PIV after induction)        Anesthesia Quick Evaluation

## 2021-11-20 ENCOUNTER — Inpatient Hospital Stay (HOSPITAL_COMMUNITY)
Admission: RE | Admit: 2021-11-20 | Discharge: 2021-11-23 | DRG: 254 | Disposition: A | Discharge status: Home Health | Payer: Medicare Other | Attending: Vascular Surgery | Admitting: Vascular Surgery

## 2021-11-20 ENCOUNTER — Other Ambulatory Visit: Payer: Self-pay

## 2021-11-20 ENCOUNTER — Encounter (HOSPITAL_COMMUNITY): Payer: Self-pay | Admitting: Vascular Surgery

## 2021-11-20 ENCOUNTER — Inpatient Hospital Stay (HOSPITAL_COMMUNITY): Payer: Medicare Other | Admitting: Vascular Surgery

## 2021-11-20 ENCOUNTER — Inpatient Hospital Stay (HOSPITAL_COMMUNITY): Payer: Medicare Other | Admitting: Anesthesiology

## 2021-11-20 ENCOUNTER — Encounter (HOSPITAL_COMMUNITY)
Admission: RE | Disposition: A | Discharge status: Home Health | Payer: Self-pay | Source: Home / Self Care | Attending: Vascular Surgery

## 2021-11-20 DIAGNOSIS — I2510 Atherosclerotic heart disease of native coronary artery without angina pectoris: Secondary | ICD-10-CM | POA: Diagnosis present

## 2021-11-20 DIAGNOSIS — Z833 Family history of diabetes mellitus: Secondary | ICD-10-CM | POA: Diagnosis not present

## 2021-11-20 DIAGNOSIS — Z951 Presence of aortocoronary bypass graft: Secondary | ICD-10-CM

## 2021-11-20 DIAGNOSIS — Z85048 Personal history of other malignant neoplasm of rectum, rectosigmoid junction, and anus: Secondary | ICD-10-CM

## 2021-11-20 DIAGNOSIS — I739 Peripheral vascular disease, unspecified: Principal | ICD-10-CM | POA: Diagnosis present

## 2021-11-20 DIAGNOSIS — Z933 Colostomy status: Secondary | ICD-10-CM | POA: Diagnosis not present

## 2021-11-20 DIAGNOSIS — Z79899 Other long term (current) drug therapy: Secondary | ICD-10-CM | POA: Diagnosis not present

## 2021-11-20 DIAGNOSIS — L97529 Non-pressure chronic ulcer of other part of left foot with unspecified severity: Secondary | ICD-10-CM | POA: Diagnosis present

## 2021-11-20 DIAGNOSIS — I70245 Atherosclerosis of native arteries of left leg with ulceration of other part of foot: Secondary | ICD-10-CM | POA: Diagnosis present

## 2021-11-20 DIAGNOSIS — Z89421 Acquired absence of other right toe(s): Secondary | ICD-10-CM | POA: Diagnosis not present

## 2021-11-20 DIAGNOSIS — I10 Essential (primary) hypertension: Secondary | ICD-10-CM | POA: Diagnosis present

## 2021-11-20 DIAGNOSIS — I70222 Atherosclerosis of native arteries of extremities with rest pain, left leg: Secondary | ICD-10-CM

## 2021-11-20 DIAGNOSIS — E785 Hyperlipidemia, unspecified: Secondary | ICD-10-CM | POA: Diagnosis present

## 2021-11-20 DIAGNOSIS — R338 Other retention of urine: Secondary | ICD-10-CM | POA: Diagnosis present

## 2021-11-20 DIAGNOSIS — N401 Enlarged prostate with lower urinary tract symptoms: Secondary | ICD-10-CM | POA: Diagnosis present

## 2021-11-20 HISTORY — PX: FEMORAL-TIBIAL BYPASS GRAFT: SHX938

## 2021-11-20 HISTORY — PX: ENDARTERECTOMY FEMORAL: SHX5804

## 2021-11-20 LAB — POCT ACTIVATED CLOTTING TIME
Activated Clotting Time: 227 seconds
Activated Clotting Time: 281 seconds
Activated Clotting Time: 245 seconds
Activated Clotting Time: 287 seconds

## 2021-11-20 LAB — ABO/RH: ABO/RH(D): A POS

## 2021-11-20 SURGERY — ENDARTERECTOMY, FEMORAL
Anesthesia: General | Site: Leg Lower | Laterality: Left

## 2021-11-20 MED ORDER — CEFAZOLIN SODIUM-DEXTROSE 2-4 GM/100ML-% IV SOLN
2.0000 g | Freq: Three times a day (TID) | INTRAVENOUS | Status: AC
  Administered 2021-11-20 (×2): 2 g via INTRAVENOUS
  Filled 2021-11-20 (×2): qty 100

## 2021-11-20 MED ORDER — PANTOPRAZOLE SODIUM 40 MG PO TBEC
40.0000 mg | DELAYED_RELEASE_TABLET | Freq: Every day | ORAL | Status: DC
  Administered 2021-11-20 – 2021-11-23 (×4): 40 mg via ORAL
  Filled 2021-11-20 (×4): qty 1

## 2021-11-20 MED ORDER — PHENYLEPHRINE HCL-NACL 20-0.9 MG/250ML-% IV SOLN
INTRAVENOUS | Status: AC
Start: 2021-11-20 — End: ?
  Filled 2021-11-20: qty 250

## 2021-11-20 MED ORDER — LABETALOL HCL 5 MG/ML IV SOLN: 10.0000 mg | INTRAVENOUS | Status: DC | PRN

## 2021-11-20 MED ORDER — HYDRALAZINE HCL 20 MG/ML IJ SOLN: 5.0000 mg | INTRAMUSCULAR | Status: DC | PRN

## 2021-11-20 MED ORDER — PROTAMINE SULFATE 10 MG/ML IV SOLN
INTRAVENOUS | Status: DC | PRN
  Administered 2021-11-20: 50 mg via INTRAVENOUS

## 2021-11-20 MED ORDER — SODIUM CHLORIDE 0.9 % IV SOLN: INTRAVENOUS | Status: DC

## 2021-11-20 MED ORDER — BISACODYL 5 MG PO TBEC: 5.0000 mg | DELAYED_RELEASE_TABLET | Freq: Every day | ORAL | Status: DC | PRN

## 2021-11-20 MED ORDER — ONDANSETRON HCL 4 MG/2ML IJ SOLN: 4.0000 mg | Freq: Four times a day (QID) | INTRAMUSCULAR | Status: DC | PRN

## 2021-11-20 MED ORDER — ONDANSETRON HCL 4 MG/2ML IJ SOLN
INTRAMUSCULAR | Status: DC | PRN
  Administered 2021-11-20: 4 mg via INTRAVENOUS

## 2021-11-20 MED ORDER — HYDROMORPHONE HCL 1 MG/ML IJ SOLN
0.5000 mg | INTRAMUSCULAR | Status: DC | PRN
  Administered 2021-11-20: 0.5 mg via INTRAVENOUS
  Filled 2021-11-20: qty 1

## 2021-11-20 MED ORDER — POTASSIUM CHLORIDE CRYS ER 20 MEQ PO TBCR: 20.0000 meq | EXTENDED_RELEASE_TABLET | Freq: Every day | ORAL | Status: DC | PRN

## 2021-11-20 MED ORDER — ACETAMINOPHEN 650 MG RE SUPP: 325.0000 mg | RECTAL | Status: DC | PRN

## 2021-11-20 MED ORDER — ONDANSETRON HCL 4 MG/2ML IJ SOLN
INTRAMUSCULAR | Status: AC
Start: 2021-11-20 — End: ?
  Filled 2021-11-20: qty 2

## 2021-11-20 MED ORDER — LACTATED RINGERS IV SOLN: INTRAVENOUS | Status: DC

## 2021-11-20 MED ORDER — FENTANYL CITRATE (PF) 250 MCG/5ML IJ SOLN
INTRAMUSCULAR | Status: AC
  Filled 2021-11-20: qty 5

## 2021-11-20 MED ORDER — LIDOCAINE 2% (20 MG/ML) 5 ML SYRINGE
INTRAMUSCULAR | Status: AC
  Filled 2021-11-20: qty 5

## 2021-11-20 MED ORDER — EPHEDRINE SULFATE-NACL 50-0.9 MG/10ML-% IV SOSY
PREFILLED_SYRINGE | INTRAVENOUS | Status: DC | PRN
  Administered 2021-11-20 (×2): 5 mg via INTRAVENOUS
  Administered 2021-11-20: 10 mg via INTRAVENOUS
  Administered 2021-11-20: 5 mg via INTRAVENOUS

## 2021-11-20 MED ORDER — AMLODIPINE BESYLATE 5 MG PO TABS
5.0000 mg | ORAL_TABLET | Freq: Every day | ORAL | Status: DC
  Administered 2021-11-21: 5 mg via ORAL
  Filled 2021-11-20 (×3): qty 1

## 2021-11-20 MED ORDER — ALUM & MAG HYDROXIDE-SIMETH 200-200-20 MG/5ML PO SUSP: 15.0000 mL | ORAL | Status: DC | PRN

## 2021-11-20 MED ORDER — DOCUSATE SODIUM 100 MG PO CAPS
100.0000 mg | ORAL_CAPSULE | Freq: Every day | ORAL | Status: DC
  Administered 2021-11-21 – 2021-11-23 (×3): 100 mg via ORAL
  Filled 2021-11-20 (×3): qty 1

## 2021-11-20 MED ORDER — SUGAMMADEX SODIUM 200 MG/2ML IV SOLN
INTRAVENOUS | Status: DC | PRN
  Administered 2021-11-20: 107.6 mg via INTRAVENOUS

## 2021-11-20 MED ORDER — MAGNESIUM SULFATE 2 GM/50ML IV SOLN: 2.0000 g | Freq: Every day | INTRAVENOUS | Status: DC | PRN

## 2021-11-20 MED ORDER — METOPROLOL TARTRATE 5 MG/5ML IV SOLN: 2.0000 mg | INTRAVENOUS | Status: DC | PRN

## 2021-11-20 MED ORDER — OXYCODONE-ACETAMINOPHEN 5-325 MG PO TABS: 1.0000 | ORAL_TABLET | ORAL | Status: DC | PRN

## 2021-11-20 MED ORDER — SODIUM CHLORIDE 0.9 % IV SOLN
500.0000 mL | Freq: Once | INTRAVENOUS | Status: AC | PRN
  Administered 2021-11-20: 500 mL via INTRAVENOUS

## 2021-11-20 MED ORDER — ACETAMINOPHEN 500 MG PO TABS
1000.0000 mg | ORAL_TABLET | Freq: Once | ORAL | Status: AC
  Administered 2021-11-20: 500 mg via ORAL
  Filled 2021-11-20: qty 2

## 2021-11-20 MED ORDER — PROPOFOL 500 MG/50ML IV EMUL
INTRAVENOUS | Status: DC | PRN
  Administered 2021-11-20: 50 ug/kg/min via INTRAVENOUS

## 2021-11-20 MED ORDER — CHLORHEXIDINE GLUCONATE CLOTH 2 % EX PADS
6.0000 | MEDICATED_PAD | Freq: Once | CUTANEOUS | Status: DC
  Administered 2021-11-20: 6 via TOPICAL

## 2021-11-20 MED ORDER — LIDOCAINE 2% (20 MG/ML) 5 ML SYRINGE
INTRAMUSCULAR | Status: DC | PRN
  Administered 2021-11-20: 60 mg via INTRAVENOUS

## 2021-11-20 MED ORDER — VANCOMYCIN HCL IN DEXTROSE 1-5 GM/200ML-% IV SOLN
1000.0000 mg | Freq: Once | INTRAVENOUS | Status: AC
  Administered 2021-11-20: 1000 mg via INTRAVENOUS
  Filled 2021-11-20: qty 200

## 2021-11-20 MED ORDER — GUAIFENESIN-DM 100-10 MG/5ML PO SYRP: 15.0000 mL | ORAL_SOLUTION | ORAL | Status: DC | PRN

## 2021-11-20 MED ORDER — MUPIROCIN 2 % EX OINT
1.0000 | TOPICAL_OINTMENT | Freq: Two times a day (BID) | CUTANEOUS | Status: DC
  Administered 2021-11-20 – 2021-11-23 (×6): 1 via NASAL
  Filled 2021-11-20: qty 22

## 2021-11-20 MED ORDER — HEMOSTATIC AGENTS (NO CHARGE) OPTIME
TOPICAL | Status: DC | PRN
  Administered 2021-11-20: 1 via TOPICAL

## 2021-11-20 MED ORDER — ORAL CARE MOUTH RINSE: 15.0000 mL | Freq: Once | OROMUCOSAL | Status: AC

## 2021-11-20 MED ORDER — PHENYLEPHRINE 80 MCG/ML (10ML) SYRINGE FOR IV PUSH (FOR BLOOD PRESSURE SUPPORT)
PREFILLED_SYRINGE | INTRAVENOUS | Status: DC | PRN
  Administered 2021-11-20: 80 ug via INTRAVENOUS

## 2021-11-20 MED ORDER — PHENOL 1.4 % MT LIQD: 1.0000 | OROMUCOSAL | Status: DC | PRN

## 2021-11-20 MED ORDER — DEXAMETHASONE SODIUM PHOSPHATE 10 MG/ML IJ SOLN
INTRAMUSCULAR | Status: DC | PRN
  Administered 2021-11-20: 10 mg via INTRAVENOUS

## 2021-11-20 MED ORDER — CEFAZOLIN SODIUM-DEXTROSE 2-4 GM/100ML-% IV SOLN
2.0000 g | INTRAVENOUS | Status: DC
  Filled 2021-11-20: qty 100

## 2021-11-20 MED ORDER — FENTANYL CITRATE (PF) 100 MCG/2ML IJ SOLN
25.0000 ug | INTRAMUSCULAR | Status: DC | PRN
  Administered 2021-11-20: 25 ug via INTRAVENOUS

## 2021-11-20 MED ORDER — ATORVASTATIN CALCIUM 10 MG PO TABS
10.0000 mg | ORAL_TABLET | Freq: Every day | ORAL | Status: DC
  Administered 2021-11-20 – 2021-11-23 (×4): 10 mg via ORAL
  Filled 2021-11-20 (×4): qty 1

## 2021-11-20 MED ORDER — ROCURONIUM BROMIDE 10 MG/ML (PF) SYRINGE
PREFILLED_SYRINGE | INTRAVENOUS | Status: AC
  Filled 2021-11-20: qty 10

## 2021-11-20 MED ORDER — PHENYLEPHRINE HCL-NACL 20-0.9 MG/250ML-% IV SOLN
INTRAVENOUS | Status: DC | PRN
  Administered 2021-11-20: 40 ug/min via INTRAVENOUS

## 2021-11-20 MED ORDER — ASPIRIN 81 MG PO TBEC
81.0000 mg | DELAYED_RELEASE_TABLET | Freq: Every day | ORAL | Status: DC
  Administered 2021-11-20 – 2021-11-23 (×4): 81 mg via ORAL
  Filled 2021-11-20 (×4): qty 1

## 2021-11-20 MED ORDER — PROTAMINE SULFATE 10 MG/ML IV SOLN
INTRAVENOUS | Status: AC
  Filled 2021-11-20: qty 5

## 2021-11-20 MED ORDER — FENTANYL CITRATE (PF) 100 MCG/2ML IJ SOLN
INTRAMUSCULAR | Status: AC
  Filled 2021-11-20: qty 2

## 2021-11-20 MED ORDER — ONDANSETRON HCL 4 MG/2ML IJ SOLN: 4.0000 mg | Freq: Once | INTRAMUSCULAR | Status: DC | PRN

## 2021-11-20 MED ORDER — HEPARIN SODIUM (PORCINE) 1000 UNIT/ML IJ SOLN
INTRAMUSCULAR | Status: DC | PRN
  Administered 2021-11-20 (×2): 2000 [IU] via INTRAVENOUS
  Administered 2021-11-20: 6000 [IU] via INTRAVENOUS

## 2021-11-20 MED ORDER — CHLORHEXIDINE GLUCONATE CLOTH 2 % EX PADS
6.0000 | MEDICATED_PAD | Freq: Every day | CUTANEOUS | Status: DC
  Administered 2021-11-21 – 2021-11-23 (×3): 6 via TOPICAL

## 2021-11-20 MED ORDER — ACETAMINOPHEN 325 MG PO TABS
325.0000 mg | ORAL_TABLET | ORAL | Status: DC | PRN
  Administered 2021-11-22: 325 mg via ORAL
  Administered 2021-11-22: 650 mg via ORAL
  Filled 2021-11-20 (×2): qty 2

## 2021-11-20 MED ORDER — EPHEDRINE 5 MG/ML INJ
INTRAVENOUS | Status: AC
  Filled 2021-11-20: qty 5

## 2021-11-20 MED ORDER — HEPARIN 6000 UNIT IRRIGATION SOLUTION
Status: DC | PRN
  Administered 2021-11-20: 1

## 2021-11-20 MED ORDER — CHLORHEXIDINE GLUCONATE CLOTH 2 % EX PADS: 6.0000 | MEDICATED_PAD | Freq: Once | CUTANEOUS | Status: DC

## 2021-11-20 MED ORDER — CHLORHEXIDINE GLUCONATE 0.12 % MT SOLN
15.0000 mL | Freq: Once | OROMUCOSAL | Status: AC
  Administered 2021-11-20: 15 mL via OROMUCOSAL
  Filled 2021-11-20: qty 15

## 2021-11-20 MED ORDER — PROPOFOL 10 MG/ML IV BOLUS
INTRAVENOUS | Status: DC | PRN
  Administered 2021-11-20: 30 mg via INTRAVENOUS
  Administered 2021-11-20: 70 mg via INTRAVENOUS

## 2021-11-20 MED ORDER — 0.9 % SODIUM CHLORIDE (POUR BTL) OPTIME
TOPICAL | Status: DC | PRN
  Administered 2021-11-20: 2000 mL

## 2021-11-20 MED ORDER — LOSARTAN POTASSIUM 50 MG PO TABS
50.0000 mg | ORAL_TABLET | Freq: Every day | ORAL | Status: DC
  Administered 2021-11-21: 50 mg via ORAL
  Filled 2021-11-20 (×3): qty 1

## 2021-11-20 MED ORDER — HEPARIN 6000 UNIT IRRIGATION SOLUTION
Status: AC
Start: 2021-11-20 — End: ?
  Filled 2021-11-20: qty 500

## 2021-11-20 MED ORDER — HEPARIN SODIUM (PORCINE) 1000 UNIT/ML IJ SOLN
INTRAMUSCULAR | Status: AC
  Filled 2021-11-20: qty 10

## 2021-11-20 MED ORDER — FLEET ENEMA 7-19 GM/118ML RE ENEM: 1.0000 | ENEMA | Freq: Once | RECTAL | Status: DC | PRN

## 2021-11-20 MED ORDER — FENTANYL CITRATE (PF) 250 MCG/5ML IJ SOLN
INTRAMUSCULAR | Status: DC | PRN
Start: 2021-11-20 — End: 2021-11-20
  Administered 2021-11-20 (×4): 50 ug via INTRAVENOUS

## 2021-11-20 MED ORDER — ROCURONIUM BROMIDE 10 MG/ML (PF) SYRINGE
PREFILLED_SYRINGE | INTRAVENOUS | Status: DC | PRN
  Administered 2021-11-20: 50 mg via INTRAVENOUS
  Administered 2021-11-20: 30 mg via INTRAVENOUS

## 2021-11-20 MED ORDER — HEPARIN SODIUM (PORCINE) 5000 UNIT/ML IJ SOLN
5000.0000 [IU] | Freq: Three times a day (TID) | INTRAMUSCULAR | Status: DC
Start: 2021-11-21 — End: 2021-11-23
  Administered 2021-11-21 – 2021-11-23 (×7): 5000 [IU] via SUBCUTANEOUS
  Filled 2021-11-20 (×7): qty 1

## 2021-11-20 MED ORDER — SENNOSIDES-DOCUSATE SODIUM 8.6-50 MG PO TABS: 1.0000 | ORAL_TABLET | Freq: Every evening | ORAL | Status: DC | PRN

## 2021-11-20 MED ORDER — DEXAMETHASONE SODIUM PHOSPHATE 10 MG/ML IJ SOLN
INTRAMUSCULAR | Status: AC
Start: 2021-11-20 — End: ?
  Filled 2021-11-20: qty 1

## 2021-11-20 SURGICAL SUPPLY — 68 items
BAG COUNTER SPONGE SURGICOUNT (BAG) ×3 IMPLANT
BANDAGE ESMARK 6X9 LF (GAUZE/BANDAGES/DRESSINGS) IMPLANT
BLADE CLIPPER SURG (BLADE) ×3 IMPLANT
BNDG ESMARK 6X9 LF (GAUZE/BANDAGES/DRESSINGS) ×3
CANISTER SUCT 3000ML PPV (MISCELLANEOUS) ×3 IMPLANT
CLIP VESOCCLUDE MED 24/CT (CLIP) ×3 IMPLANT
CLIP VESOCCLUDE SM WIDE 24/CT (CLIP) ×3 IMPLANT
COVER PROBE W GEL 5X96 (DRAPES) ×3 IMPLANT
CUFF TOURN SGL QUICK 18X4 (TOURNIQUET CUFF) IMPLANT
CUFF TOURN SGL QUICK 24 (TOURNIQUET CUFF)
CUFF TOURN SGL QUICK 34 (TOURNIQUET CUFF) ×3
CUFF TOURN SGL QUICK 42 (TOURNIQUET CUFF) IMPLANT
CUFF TRNQT CYL 24X4X16.5-23 (TOURNIQUET CUFF) IMPLANT
CUFF TRNQT CYL 34X4.125X (TOURNIQUET CUFF) IMPLANT
CUFF TRNQT CYL 34X4X40X1 (TOURNIQUET CUFF) IMPLANT
DERMABOND ADVANCED (GAUZE/BANDAGES/DRESSINGS) ×2
DERMABOND ADVANCED .7 DNX12 (GAUZE/BANDAGES/DRESSINGS) ×2 IMPLANT
DRAIN CHANNEL 15F RND FF W/TCR (WOUND CARE) IMPLANT
DRAPE C-ARM 42X72 X-RAY (DRAPES) ×3 IMPLANT
DRAPE HALF SHEET 40X57 (DRAPES) IMPLANT
ELECT REM PT RETURN 9FT ADLT (ELECTROSURGICAL) ×3
ELECTRODE REM PT RTRN 9FT ADLT (ELECTROSURGICAL) ×2 IMPLANT
EVACUATOR SILICONE 100CC (DRAIN) IMPLANT
GAUZE 4X4 16PLY ~~LOC~~+RFID DBL (SPONGE) ×1 IMPLANT
GLOVE BIO SURGEON STRL SZ 6.5 (GLOVE) ×2 IMPLANT
GLOVE BIO SURGEON STRL SZ7.5 (GLOVE) ×3 IMPLANT
GLOVE BIOGEL PI IND STRL 6.5 (GLOVE) IMPLANT
GLOVE BIOGEL PI IND STRL 7.0 (GLOVE) IMPLANT
GLOVE BIOGEL PI IND STRL 8 (GLOVE) ×2 IMPLANT
GLOVE BIOGEL PI INDICATOR 6.5 (GLOVE) ×3
GLOVE BIOGEL PI INDICATOR 7.0 (GLOVE) ×2
GLOVE BIOGEL PI INDICATOR 8 (GLOVE) ×1
GOWN STRL REUS W/ TWL LRG LVL3 (GOWN DISPOSABLE) ×6 IMPLANT
GOWN STRL REUS W/ TWL XL LVL3 (GOWN DISPOSABLE) ×4 IMPLANT
GOWN STRL REUS W/TWL LRG LVL3 (GOWN DISPOSABLE) ×9
GOWN STRL REUS W/TWL XL LVL3 (GOWN DISPOSABLE) ×6
GRAFT PROPATEN W/RING 6X80X60 (Vascular Products) ×1 IMPLANT
HEMOSTAT SNOW SURGICEL 2X4 (HEMOSTASIS) ×1 IMPLANT
HEMOSTAT SPONGE AVITENE ULTRA (HEMOSTASIS) IMPLANT
KIT BASIN OR (CUSTOM PROCEDURE TRAY) ×3 IMPLANT
KIT TURNOVER KIT B (KITS) ×3 IMPLANT
LOOP VESSEL MINI RED (MISCELLANEOUS) ×2 IMPLANT
NS IRRIG 1000ML POUR BTL (IV SOLUTION) ×6 IMPLANT
PACK PERIPHERAL VASCULAR (CUSTOM PROCEDURE TRAY) ×3 IMPLANT
PAD ARMBOARD 7.5X6 YLW CONV (MISCELLANEOUS) ×6 IMPLANT
PATCH VASC XENOSURE 1CMX6CM (Vascular Products) ×3 IMPLANT
PATCH VASC XENOSURE 1X6 (Vascular Products) IMPLANT
PENCIL BUTTON HOLSTER BLD 10FT (ELECTRODE) ×1 IMPLANT
POWDER SURGICEL 3.0 GRAM (HEMOSTASIS) ×1 IMPLANT
SPONGE T-LAP 18X18 ~~LOC~~+RFID (SPONGE) ×2 IMPLANT
STOPCOCK 4 WAY LG BORE MALE ST (IV SETS) ×3 IMPLANT
SUT MNCRL AB 4-0 PS2 18 (SUTURE) ×9 IMPLANT
SUT PROLENE 5 0 C 1 24 (SUTURE) ×7 IMPLANT
SUT PROLENE 6 0 BV (SUTURE) ×18 IMPLANT
SUT PROLENE 7 0 BV 1 (SUTURE) IMPLANT
SUT SILK 2 0 PERMA HAND 18 BK (SUTURE) ×1 IMPLANT
SUT SILK 2 0 SH (SUTURE) ×3 IMPLANT
SUT SILK 3 0 (SUTURE)
SUT SILK 3-0 18XBRD TIE 12 (SUTURE) IMPLANT
SUT VIC AB 2-0 CT1 27 (SUTURE) ×6
SUT VIC AB 2-0 CT1 TAPERPNT 27 (SUTURE) ×4 IMPLANT
SUT VIC AB 3-0 SH 27 (SUTURE) ×15
SUT VIC AB 3-0 SH 27X BRD (SUTURE) ×4 IMPLANT
TOWEL GREEN STERILE (TOWEL DISPOSABLE) ×3 IMPLANT
TRAY FOLEY MTR SLVR 16FR STAT (SET/KITS/TRAYS/PACK) ×3 IMPLANT
TUBING EXTENTION W/L.L. (IV SETS) ×3 IMPLANT
UNDERPAD 30X36 HEAVY ABSORB (UNDERPADS AND DIAPERS) ×3 IMPLANT
WATER STERILE IRR 1000ML POUR (IV SOLUTION) ×3 IMPLANT

## 2021-11-20 NOTE — Transfer of Care (Signed)
Immediate Anesthesia Transfer of Care Note  Patient: [name]  Procedure(s) Performed: LEFT COMMON FEMORAL ENDARTERECTOMY WITH PATCH ANGIOPLASTY USING 1 CM X 6 CM XENOSURE BIOLOGIC PATCH (Left: Groin) HARVEST LEFT LEG GREATER SAPHENOUS VEIN, VEIN PATCH OF POSTERIOR TIBIAL ANGIOPLASTY. LEFT FEMORAL-POSTERIOR TIBIAL BYPASS GRAFT USING 6 MMX 80 CM PROPATEN GORE GRAFT WITH RING.  (Left: Leg Lower)  Patient Location: PACU  Anesthesia Type:General  Level of Consciousness: sedated  Airway & Oxygen Therapy: Patient Spontanous Breathing and Patient connected to face mask oxygen  Post-op Assessment: Report given to RN and Post -op Vital signs reviewed and stable  Post vital signs: Reviewed and stable  Last Vitals:  Vitals Value Taken Time  BP 103/52 11/20/21 1245  Temp    Pulse 79 11/20/21 1253  Resp 17 11/20/21 1253  SpO2 100 % 11/20/21 1253  Vitals shown include unvalidated device data.  Last Pain:  Vitals:   11/20/21 0647  PainSc: 0-No pain      Patients Stated Pain Goal: 0 (82/99/37 1696)  Complications: No notable events documented.

## 2021-11-20 NOTE — Progress Notes (Signed)
Patient received from PACU .vitals stable patient left leg and the groin  incisions intact with skin glue, soft bruising present in the leg.

## 2021-11-20 NOTE — Anesthesia Procedure Notes (Signed)
Procedure Name: Intubation Date/Time: 11/20/2021 8:35 AM  Performed by: Minerva Ends, CRNAPre-anesthesia Checklist: Patient identified, Emergency Drugs available, Suction available and Patient being monitored Patient Re-evaluated:Patient Re-evaluated prior to induction Oxygen Delivery Method: Circle system utilized Preoxygenation: Pre-oxygenation with 100% oxygen Induction Type: IV induction Ventilation: Mask ventilation without difficulty Laryngoscope Size: Mac and 3 Grade View: Grade I Tube type: Oral Tube size: 7.0 mm Number of attempts: 1 Airway Equipment and Method: Stylet and Oral airway Placement Confirmation: ETT inserted through vocal cords under direct vision, positive ETCO2 and breath sounds checked- equal and bilateral Secured at: 23 cm Tube secured with: Tape Dental Injury: Teeth and Oropharynx as per pre-operative assessment

## 2021-11-20 NOTE — H&P (Signed)
History and Physical Interval Note:  11/20/2021 8:17 AM  [name]  has presented today for surgery, with the diagnosis of Critical limb ischemia of left lower extremity with tissue loss.  The various methods of treatment have been discussed with the patient and family. After consideration of risks, benefits and other options for treatment, the patient has consented to  Procedure(s) with comments: LEFT COMMON FEMORAL ENDARTERECTOMY (Left) LEFT FEMORAL-POSTERIOR TIBIAL BYPASS (Left) - INSERT ARTERIAL LINE as a surgical intervention.  The patient's history has been reviewed, patient examined, no change in status, stable for surgery.  I have reviewed the patient's chart and labs.  Questions were answered to the patient's satisfaction.    Discussed plan for left common femoral endarterectomy with a left common femoral to PT bypass for multilevel occlusive disease in the setting of critical limb ischemia with tissue loss.  Left 4th and 5th toe wounds.  ABI 0.46.  Runoff evaluated with angiogram.  Discussed he has no vein on vein mapping and will likely require graft.  Risk benefits discussed including risk of anesthesia, MI, renal dysfunction including dialysis, bleeding, infection etc.  He wishes to proceed.  Marty Schroeder  [facility] and Ve[facility] Specialist of [place]   Patient name: [name]         MRN: [id number]        DOB: 01-Jul-1934          Sex: male   REASON FOR CONSULT: Evaluation critical limb ischemia left leg   HPI: [name] is a 87 y.o. male, who is here today for evaluation.  He is very pleasant animated gentleman with ulceration between his fourth and fifth toe.  He relates his difficulty to COVID shot.  He does have a long history of arterial disease.  He had coronary artery bypass grafting in 1999.  He had harvest of vein from his right leg.  He has had a several month history of ulceration on the medial aspect of his fifth toe and lateral aspect of his  fourth toe of his left foot.  This has been slow to heal.  He does not have claudication symptoms.  He did have traumatic loss of his right fourth and fifth toe from hunting accident in 1953.  He has end colostomy for rectal cancer.  Also has history of left inguinal hernia.       Past Medical History:  Diagnosis Date   CAD (coronary artery disease)      Status post CABG 1999   Essential hypertension     Hyperlipidemia     Rectal cancer (HCC)             Family History  Problem Relation Age of Onset   Alcoholism Father     Diabetes Mellitus II Father        SOCIAL HISTORY: Social History         Socioeconomic History   Marital status: Married      Spouse name: Not on file   Number of children: Not on file   Years of education: Not on file   Highest education level: Not on file  Occupational History   Occupation: Retired  Tobacco Use   Smoking status: Never   Smokeless tobacco: Current      Types: Chew  Substance and Sexual Activity   Alcohol use: No   Drug use: No   Sexual activity: Not on file  Other Topics Concern   Not on file  Social  History Narrative    Exercises regularly    Social Determinants of Health    Financial Resource Strain: Not on file  Food Insecurity: Not on file  Transportation Needs: Not on file  Physical Activity: Not on file  Stress: Not on file  Social Connections: Not on file  Intimate Partner Violence: Not on file      No Known Allergies         Current Outpatient Medications  Medication Sig Dispense Refill   amLODipine (NORVASC) 5 MG tablet Take 5 mg by mouth daily.       atorvastatin (LIPITOR) 10 MG tablet Take 10 mg by mouth daily.       fish oil-omega-3 fatty acids 1000 MG capsule Take 3 g by mouth daily.       folic acid (FOLVITE) 341 MCG tablet Take 400 mcg by mouth daily.       losartan (COZAAR) 50 MG tablet Take 50 mg by mouth daily.       Multiple Vitamin (MULTIVITAMIN) tablet Take 1 tablet by mouth daily.        ALPRAZolam (XANAX) 0.5 MG tablet Take 0.5 mg by mouth daily. (Patient not taking: Reported on 11/07/2020)       tamsulosin (FLOMAX) 0.4 MG CAPS capsule Take 1 capsule (0.4 mg total) by mouth at bedtime. (Patient not taking: Reported on 11/07/2020) 90 capsule 3    No current facility-administered medications for this visit.      PHYSICAL EXAM:    Vitals:    10/23/21 1350  BP: (!) 141/70  Pulse: 86  Temp: 99.2 F (37.3 C)  TempSrc: Temporal  SpO2: 96%  Weight: 123 lb (55.8 kg)  Height: '5\' 8"'$  (1.727 m)      GENERAL: The patient is a well-nourished male, in no acute distress. The vital signs are documented above. CARDIOVASCULAR: 2+ radial pulses bilaterally.  2+ right femoral pulse and absent left femoral pulse.  I do not palpate popliteal or distal pulses. Abdominal exam reveals left lower quadrant and colostomy PULMONARY: There is good air exchange  MUSCULOSKELETAL: There are no major deformities or cyanosis.  Surgically absent right fourth and fifth toe NEUROLOGIC: No focal weakness or paresthesias are detected. SKIN: There are no ulcers or rashes noted. PSYCHIATRIC: The patient has a normal affect.   DATA:  Noninvasive studies from [facility] reviewed.  This reveals ankle arm index of 0.63 on the right and 0.46 on the left.  Toe brachial index is 0.53 on the right and 0.28 on the left   MEDICAL ISSUES: Critical limb ischemia right foot with nonhealing ulceration of the fourth and fifth toes.  I do not palpate a femoral pulse on the left.  I have recommended arteriography for further evaluation.  I did explain that there is a high likelihood that he would have a possibility for angioplasty for treatment of his ischemia.  This will be coordinated at [facility] at his earliest [place]. Early, MD [facility] and Vein Specialists of [facility] Tel [phone] Pager [phone]   Note: Portions of this report may have been transcribed  using voice recognition software.  Every effort has been made to ensure accuracy; however, inadvertent computerized transcription errors may still be present.

## 2021-11-20 NOTE — Anesthesia Procedure Notes (Signed)
Arterial Line Insertion Start/End8/05/2021 7:30 AM, 11/20/2021 7:40 AM Performed by: Santa Lighter, MD, Minerva Ends, CRNA, CRNA  Patient location: Pre-op. Preanesthetic checklist: patient identified, IV checked, site marked, risks and benefits discussed, surgical consent, monitors and equipment checked, pre-op evaluation, timeout performed and anesthesia consent Lidocaine 1% used for infiltration Right, radial was placed Catheter size: 20 G Hand hygiene performed  and maximum sterile barriers used   Attempts: 1 Procedure performed without using ultrasound guided technique. Following insertion, dressing applied and Biopatch. Post procedure assessment: normal and unchanged  Patient tolerated the procedure well with no immediate complications.

## 2021-11-20 NOTE — Anesthesia Postprocedure Evaluation (Signed)
Anesthesia Post Note  Patient: [name]  Procedure(s) Performed: LEFT COMMON FEMORAL ENDARTERECTOMY WITH PATCH ANGIOPLASTY USING 1 CM X 6 CM XENOSURE BIOLOGIC PATCH (Left: Groin) HARVEST LEFT LEG GREATER SAPHENOUS VEIN, VEIN PATCH OF POSTERIOR TIBIAL ANGIOPLASTY. LEFT FEMORAL-POSTERIOR TIBIAL BYPASS GRAFT USING 6 MMX 80 CM PROPATEN GORE GRAFT WITH RING.  (Left: Leg Lower)     Patient location during evaluation: PACU Anesthesia Type: General Level of consciousness: awake and alert Pain management: pain level controlled Vital Signs Assessment: post-procedure vital signs reviewed and stable Respiratory status: spontaneous breathing, nonlabored ventilation, respiratory function stable and patient connected to nasal cannula oxygen Cardiovascular status: blood pressure returned to baseline and stable Postop Assessment: no apparent nausea or vomiting Anesthetic complications: no   No notable events documented.  Last Vitals:  Vitals:   11/20/21 1641 11/20/21 1642  BP:    Pulse: 86   Resp: 17   Temp:    SpO2: 99% 99%    Last Pain:  Vitals:   11/20/21 1600  PainSc: 0-No pain                 Santa Lighter

## 2021-11-20 NOTE — Op Note (Signed)
Date: November 20, 2021  Preoperative diagnosis: Critical limb ischemia of the left lower extremity with tissue loss (left fourth and fifth toes)  Postoperative diagnosis: Same  Procedure: 1.  Harvest of left leg great saphenous vein 2.  Left common femoral endarterectomy with bovine pericardial patch angioplasty 3.  Vein patch angioplasty of the left posterior tibial artery in the mid calf using great saphenous vein 4.  Left common femoral to posterior tibial artery bypass with 6 mm ringed PTFE  Surgeon: Dr. [name], MD  Assistant: Paulo Fruit, PA  Indications: 87 year old male seen in [place] by Dr. Donnetta Hutching with critical limb ischemia with tissue loss in the left leg.  His ABI was 0.46.  He underwent angiogram that showed multilevel occlusive disease including high-grade stenosis in the left common femoral artery as well as multilevel occlusions in the infrainguinal runoff.  He presents today for planned left common femoral to posterior tibial artery bypass after risk benefits discussed.  I discussed he has no vein on mapping and likely will be done with prosthetic graft.  An assistant was needed for exposure and to expedite the case and to sew the anastomosis.    Findings: Initially evaluated the left calf and thigh and only found a short segment of saphenous vein that was not adequate for conduit.  Ultimately a horizontal groin incision was made in the left groin and the common femoral artery was circumferentially calcified with no pulse.  I then went down and exposed the posterior tibial artery in the mid calf and this was quite small and diseased.  I also made a exposure at the below-knee popliteal artery to tunnel the graft.  Ultimately we then tunneled a 6 mm ringed PTFE graft from the common femoral artery subfascial subsartorial through the popliteal space down to the posterior tibial artery.  Initially started in the left groin and extensive endarterectomy was performed  including eversion endarterectomy of the profunda and SFA.  A bovine pericardial patch was then sewn with excellent femoral pulse at completion and good inflow.  I then sewed a 6 mm ringed PTFE graft to the left common femoral patch.  The graft had already been tunneled.  The posterior tibial artery was quite small and diseased.  I sewed a vein patch on to the mid posterior tibial artery suing a piece of great saphenous vein and then sewed the graft end to side to the vein patch on the mid posterior tibial artery.  Brisk PT signal at completion.  Anesthesia: General  Details: Patient was taken to the operating room after informed consent was obtained.  Placed on operative table supine position.  General endotracheal anesthesia induced.  Antibiotics were given.  Timeout performed.  Initially used ultrasound to evaluate the saphenous vein in the left leg and this was small as indicated on preoperative vein mapping.  We then started in the left groin with a horizontal groin incision dissected down through the subcutaneous tissue and opened the femoral sheath longitudinally.  Common femoral artery as well as the SFA profunda and distal external iliac were all dissected out.  This was circumferentially calcified left common femoral artery that had no pulse.  All the branches were controlled with Vesseloops.  I then went down to the medial calf in the midportion and made a longitudinal incision and then took down the soleus off of the tibia to get into the deep space and find the posterior tibial artery.  This was dissected out and this was quite  small and diseased but it was dissected out Bovie cautery and controlled Vesseloops.  I then made a separate incision over the below-knee popliteal space on the medial upper calf and dissected into the popliteal space in order to tunnel the graft.  I then used a curved Gore tunneler and tunneled from the below-knee popliteal space up into the groin subfascial subsartorial.   The graft was then passed through the tunneler making sure not to twist it.  I then finished passing the graft down to the mid calf through the skin bridge and under the fascial.  We ensured we had 1-1 pull in the graft.  I also harvested a short segment of saphenous vein that was exposed in the mid calf that was not large enough or long enough for conduit.  Patient was given 100 units/kg IV heparin.  ACT was checked to maintain greater than 250.  Initially controlled the SFA and profunda with Vesseloops and the external iliac artery with a Henley clamp.  The left common femoral artery was opened with 11 blade scalpel extended arteriotomy with Potts scissors.  Endarterectomy was then performed with [facility] and I performed eversion endarterectomy of the two main profunda branches and SFA.  We had excellent pulsatile inflow.  A bovine pericardial patch was sewn to the left common femoral artery with 5-0 Prolene parachute technique and this was de-aired prior to completion with the help of my assisant.  I did put several repair stitches with 5-0 Prolene in the patch.  I already tunneled the 6 mm ringed PTFE graft as indicated.  The graft was then spatulated and I again controlled the patch with vascular clamps and the patch was opened with 11 blade scalpel extended with Potts scissors and then an end to side anastomosis was sewn to the left groin patch on the common femoral artery using 6-0 Prolene parachute technique.  We had good pulsatile flow distally in the graft.  I then straighten the leg to the appropriate length anbd cut the graft appropriately.  A tourniquet was placed on the upper thigh and exsanguinated the leg and then went up to 250 mmHg to control the posterior tibial given it was quite small and diseased and friable.  11 blade scalpel was then used to open the posterior tibial artery in the wound in the mid calf where it was patent on angiogram.  I extended this with Potts scissors.  I felt  the artery was too small to sew the graft onto directly.  I used my vein that had been harvested and this was then spatulated after the vein was fully opened and we sewed a vein patch on to the mid PT artery with 6-0 Prolene parachute technique with the help of my assistant.  We then spatulated the graft and then performed an arteriotomy in the vein patch on the posterior tibial artery and then performed an end to side anastomosis with 6-0 Prolene parachute technique.  This was de-aired prior to completion.  We came down with a tourniquet and had excellent pulse in the graft.  There was brisk triphasic signal in the posterior tibial artery into the ankle.  Several repair sutures were placed in the vein patch for repair.  Protamine was given for reversal.  We then irrigated out all the incisions and closed using multiple layers of 2-0 Vicryl, 3-0 Vicryl, 4-0 Monocryl and Dermabond.  Taken to recovery in stable condition.  Complication: None  Condition: Stable  [name], MD [facility] Office: [place]

## 2021-11-21 ENCOUNTER — Encounter (HOSPITAL_COMMUNITY): Payer: Self-pay | Admitting: Vascular Surgery

## 2021-11-21 LAB — CBC
HCT: 23.1 % — ABNORMAL LOW (ref 39.0–52.0)
Hemoglobin: 7.9 g/dL — ABNORMAL LOW (ref 13.0–17.0)
MCH: 30.6 pg (ref 26.0–34.0)
MCHC: 34.2 g/dL (ref 30.0–36.0)
MCV: 89.5 fL (ref 80.0–100.0)
Platelets: 116 10*3/uL — ABNORMAL LOW (ref 150–400)
RBC: 2.58 MIL/uL — ABNORMAL LOW (ref 4.22–5.81)
RDW: 12.9 % (ref 11.5–15.5)
WBC: 11.4 10*3/uL — ABNORMAL HIGH (ref 4.0–10.5)
nRBC: 0 % (ref 0.0–0.2)

## 2021-11-21 LAB — LIPID PANEL
Cholesterol: 85 mg/dL (ref 0–200)
HDL: 40 mg/dL — ABNORMAL LOW (ref >40)
LDL Cholesterol: 42 mg/dL (ref 0–99)
Total CHOL/HDL Ratio: 2.1 RATIO
Triglycerides: 16 mg/dL (ref <150)
VLDL: 3 mg/dL (ref 0–40)

## 2021-11-21 LAB — BASIC METABOLIC PANEL
Anion gap: 5 (ref 5–15)
BUN: 12 mg/dL (ref 8–23)
CO2: 23 mmol/L (ref 22–32)
Calcium: 8 mg/dL — ABNORMAL LOW (ref 8.9–10.3)
Chloride: 109 mmol/L (ref 98–111)
Creatinine, Ser: 0.83 mg/dL (ref 0.61–1.24)
GFR, Estimated: >60 mL/min (ref >60)
Glucose, Bld: 129 mg/dL — ABNORMAL HIGH (ref 70–99)
Potassium: 4.6 mmol/L (ref 3.5–5.1)
Sodium: 137 mmol/L (ref 135–145)

## 2021-11-21 LAB — PREPARE RBC (CROSSMATCH)

## 2021-11-21 LAB — HEMOGLOBIN AND HEMATOCRIT, BLOOD
HCT: 26.3 % — ABNORMAL LOW (ref 39.0–52.0)
Hemoglobin: 9.1 g/dL — ABNORMAL LOW (ref 13.0–17.0)

## 2021-11-21 MED ORDER — PROPOFOL 1000 MG/100ML IV EMUL
INTRAVENOUS | Status: AC
  Filled 2021-11-21: qty 600

## 2021-11-21 NOTE — Progress Notes (Signed)
Mobility Specialist: Progress Note   11/21/21 1508  Mobility  Activity Ambulated with assistance in hallway  Level of Assistance Contact guard assist, steadying assist  Assistive Device Front wheel walker  Distance Ambulated (ft) 140 ft  Activity Response Tolerated well  $Mobility charge 1 Mobility   Post-Mobility: 94 HR, 98% SpO2  Pt received in the bed, reluctant but agreeable to mobility. Mod I with bed mobility and contact guard during ambulation. No c/o throughout session. Pt to the recliner after session with call bell and phone in reach.   [facility] Aloni Chuang Mobility Specialist Mobility Specialist [address]: [phone]
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
## 2021-11-21 NOTE — Progress Notes (Addendum)
Progress Note    11/21/2021 8:08 AM 1 Day Post-Op  Subjective:  feels good   Vitals:   11/20/21 2329 11/21/21 0315  BP: (!) 99/51 (!) 97/48  Pulse: 84 74  Resp: 20 20  Temp: 97.6 F (36.4 C) (!) 97.5 F (36.4 C)  SpO2: 98% 98%   Physical Exam: Cardiac:  regular Lungs:  non labored Incisions:  left groin and left leg incisions are c/d/I. Left groin with ecchymosis. Soft, without hematoma Extremities:  well perfused and warm. Brisk PT and doppler DP/ Pero signals Abdomen:  soft, non distended Neurologic: alert and oriented  CBC    Component Value Date/Time   WBC 11.4 (H) 11/21/2021 0600   RBC 2.58 (L) 11/21/2021 0600   HGB 7.9 (L) 11/21/2021 0600   HCT 23.1 (L) 11/21/2021 0600   PLT 116 (L) 11/21/2021 0600   MCV 89.5 11/21/2021 0600   MCH 30.6 11/21/2021 0600   MCHC 34.2 11/21/2021 0600   RDW 12.9 11/21/2021 0600   LYMPHSABS 1.5 08/07/2020 1328   MONOABS 0.6 08/07/2020 1328   EOSABS 0.1 08/07/2020 1328   BASOSABS 0.0 08/07/2020 1328    BMET    Component Value Date/Time   NA 137 11/21/2021 0600   K 4.6 11/21/2021 0600   CL 109 11/21/2021 0600   CO2 23 11/21/2021 0600   GLUCOSE 129 (H) 11/21/2021 0600   BUN 12 11/21/2021 0600   CREATININE 0.83 11/21/2021 0600   CALCIUM 8.0 (L) 11/21/2021 0600   GFRNONAA >60 11/21/2021 0600   GFRAA  09/19/2008 1157    >60        The eGFR has been calculated using the MDRD equation. This calculation has not been validated in all clinical situations. eGFR's persistently <60 mL/min signify possible Chronic Kidney Disease.    INR    Component Value Date/Time   INR 1.1 11/18/2021 1409     Intake/Output Summary (Last 24 hours) at 11/21/2021 2951 Last data filed at 11/21/2021 0600 Gross per 24 hour  Intake 3948.44 ml  Output 1540 ml  Net 2408.44 ml     Assessment/Plan:  87 y.o. male is s/p 1.  Harvest of left leg great saphenous vein 2.  Left common femoral endarterectomy with bovine pericardial patch  angioplasty 3.  Vein patch angioplasty of the left posterior tibial artery in the mid calf using great saphenous vein 4.  Left common femoral to posterior tibial artery bypass with 6 mm ringed PTFE 1 Day Post-Op   Left leg is well perfused and warm with doppler PT/ DP/ Peroneal signals Left leg incisions are intact and well appearing. Left groin mild ecchymosis, soft without hematoma Voiding without difficulty Tolerating diet Hgb dropped 14-7.9. BP soft. Will order 1 unit PRBC Hold home BP meds PT/ OT to eval today with recs  DVT prophylaxis:  sq heparin    Karoline Caldwell, PA-C [facility] [phone] 11/21/2021 8:08 AM  I have seen and evaluated the patient. I agree with the PA note as documented above.  87 year old male postop day 1 status post left common femoral endarterectomy, left PT vein patch angioplasty, and a left common femoral to PT bypass with PTFE for CLI with tissue loss.  He has a very brisk PT signal at the ankle.  His incisions look good.  Out of bed to a chair this morning.  We will give 1 unit of blood for hemoglobin of 7.9 given soft blood pressures overnight.  Overall looks good.  Appreciate therapy input.  Aspirin statin.    [name], MD [facility] [place] Office: [phone]

## 2021-11-22 LAB — TYPE AND SCREEN
ABO/RH(D): A POS
Antibody Screen: NEGATIVE
Unit division: 0

## 2021-11-22 LAB — CBC
HCT: 28 % — ABNORMAL LOW (ref 39.0–52.0)
Hemoglobin: 9.5 g/dL — ABNORMAL LOW (ref 13.0–17.0)
MCH: 30.3 pg (ref 26.0–34.0)
MCHC: 33.9 g/dL (ref 30.0–36.0)
MCV: 89.2 fL (ref 80.0–100.0)
Platelets: 110 10*3/uL — ABNORMAL LOW (ref 150–400)
RBC: 3.14 MIL/uL — ABNORMAL LOW (ref 4.22–5.81)
RDW: 14.2 % (ref 11.5–15.5)
WBC: 12.4 10*3/uL — ABNORMAL HIGH (ref 4.0–10.5)
nRBC: 0 % (ref 0.0–0.2)

## 2021-11-22 LAB — BPAM RBC
Blood Product Expiration Date: 202309012359
ISSUE DATE / TIME: 202308031053
Unit Type and Rh: 6200

## 2021-11-22 NOTE — Progress Notes (Addendum)
  Progress Note    11/22/2021 7:50 AM 2 Days Post-Op  Subjective:  Patient says he feels good. He would like to go home tomorrow    Vitals:   11/22/21 0004 11/22/21 0357  BP: 133/61 134/88  Pulse: 84 84  Resp: 19 15  Temp: 97.6 F (36.4 C) 97.6 F (36.4 C)  SpO2: 97% 96%    Physical Exam: Lungs:  nonlabored Incisions:  LLE incisions c/d/l without hematoma. Groin slightly bruised but no evidence of hematoma. Extremities:  Brisk signals in left DP/PT/Peroneal   CBC    Component Value Date/Time   WBC 11.4 (H) 11/21/2021 0600   RBC 2.58 (L) 11/21/2021 0600   HGB 9.1 (L) 11/21/2021 1430   HCT 26.3 (L) 11/21/2021 1430   PLT 116 (L) 11/21/2021 0600   MCV 89.5 11/21/2021 0600   MCH 30.6 11/21/2021 0600   MCHC 34.2 11/21/2021 0600   RDW 12.9 11/21/2021 0600   LYMPHSABS 1.5 08/07/2020 1328   MONOABS 0.6 08/07/2020 1328   EOSABS 0.1 08/07/2020 1328   BASOSABS 0.0 08/07/2020 1328    BMET    Component Value Date/Time   NA 137 11/21/2021 0600   K 4.6 11/21/2021 0600   CL 109 11/21/2021 0600   CO2 23 11/21/2021 0600   GLUCOSE 129 (H) 11/21/2021 0600   BUN 12 11/21/2021 0600   CREATININE 0.83 11/21/2021 0600   CALCIUM 8.0 (L) 11/21/2021 0600   GFRNONAA >60 11/21/2021 0600   GFRAA  09/19/2008 1157    >60        The eGFR has been calculated using the MDRD equation. This calculation has not been validated in all clinical situations. eGFR's persistently <60 mL/min signify possible Chronic Kidney Disease.    INR    Component Value Date/Time   INR 1.1 11/18/2021 1409     Intake/Output Summary (Last 24 hours) at 11/22/2021 0750 Last data filed at 11/22/2021 0004 Gross per 24 hour  Intake 510 ml  Output 700 ml  Net -190 ml     Assessment/Plan:  87 y.o. male is 2 days post op, s/p:  1.  Harvest of left leg great saphenous vein 2.  Left common femoral endarterectomy with bovine pericardial patch angioplasty 3.  Vein patch angioplasty of the left posterior  tibial artery in the mid calf using great saphenous vein 4.  Left common femoral to posterior tibial artery bypass with 6 mm ringed PTFE    -Left leg well perfused with brisk doppler PT/DP/Peroneal signals -LLE incisions c/d/l. Mild bruising of L groin without hematoma -PT has recommended patient go home with HHPT. Will order face to face tomorrow -Plan to discharge tomorrow -Continue aspirin and statin -DVT prophylaxis:  sq heparin   Vicente Serene, PA-C [facility] [phone] 11/22/2021 7:50 AM   I have seen and evaluated the patient. I agree with the PA note as documented above.  Postop day 2 status post left common femoral endarterectomy with a common femoral to PT bypass with 6 mm PTFE for CLI with tissue loss.  Incisions look good.  He has a brisk PT signal at the ankle.  Feels he needs 1 more day.  Discussed hopefully discharge tomorrow.  Aspirin statin at discharge.  PT recommends home health.  [name], MD [facility] [place] Office: [phone]
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
## 2021-11-22 NOTE — Progress Notes (Signed)
Pt BP 88/48 up to 105/51. BP meds held. Pt asymptomatic. PA notified.  Daymon Larsen, RN

## 2021-11-22 NOTE — TOC Initial Note (Signed)
Transition of Care (TOC) - Initial/Assessment Note  Marvetta Gibbons RN, BSN Transitions of Care Unit 4E- RN Case Manager See Treatment Team for direct phone #    Patient Details  Name: [name] MRN: [id number] Date of Birth: [birth year]  Transition of Care [facility]) CM/SW Contact:    Dawayne Patricia, RN Phone Number: 11/22/2021, 1:45 PM  Clinical Narrative:                 Pt from home w/ wife, CM notified by [facility] that they have office [facility] protocol referral for any [facility] needs post discharge. They have spoken with pt and are aware of possible d/c over weekend.   No further TOC needs noted. Pt has RW and cane at home/  Expected Discharge Plan: [place] Barriers to Discharge: No Barriers Identified   Patient Goals and CMS Choice     Choice offered to / list presented to :  (Vascular office [facility] protocol)  Expected Discharge Plan and Services Expected Discharge Plan: [place]   Discharge Planning Services: CM Consult Post Acute Care Choice: [place] arrangements for the past 2 months: Single Family Home                 DME Arranged: N/A DME Agency: NA       HH Arranged: PT HH Agency: [place] Date [place]: 11/22/21 Time HH Agency Contacted: 1000 Representative spoke with at [place]: Lattie Haw  Prior Living Arrangements/Services Living arrangements for the past 2 months: [place] Lives with:: Spouse              Current home services: DME (RW and cane)    Activities of Daily Living      Permission Sought/Granted                  Emotional Assessment         Alcohol / Substance Use: Not Applicable Psych Involvement: No (comment)  Admission diagnosis:  Peripheral artery disease ([place]) [I73.9] Patient Active Problem List   Diagnosis Date Noted   Peripheral artery disease ([place]) 11/20/2021   Benign prostatic hyperplasia with urinary obstruction 01/27/2020   Urinary  retention 01/27/2020   CAD, ARTERY BYPASS GRAFT 04/25/2009   HYPERLIPIDEMIA 04/24/2009   HYPERTENSION 04/24/2009   PCP:  Celene Squibb, MD Pharmacy:   [facility] [address], [place] [address] Phone: [phone] Fax: [phone] Ollen Gross, [place] [address] [place] [address] Phone: [phone] Fax: [phone]     Social Determinants of Health (SDOH) Interventions    Readmission Risk Interventions     No data to display
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
## 2021-11-22 NOTE — Care Management Important Message (Signed)
Important Message  Patient Details  Name: [name] MRN: [id number] Date of Birth: 1934/08/19   Medicare Important Message Given:  Yes     Orbie Pyo 11/22/2021, 2:19 PM

## 2021-11-22 NOTE — Progress Notes (Signed)
Mobility Specialist: Progress Note   11/22/21 1444  Mobility  Activity Ambulated with assistance in hallway  Level of Assistance Minimal assist, patient does 75% or more  Assistive Device Front wheel walker  Distance Ambulated (ft) 140 ft  Activity Response Tolerated well  $Mobility charge 1 Mobility   Pre-Mobility: 80 HR, 94/51 (65) BP, 100% SpO2 Post-Mobility: 94 HR, 119/61 (79) BP, 100% SpO2  Pt received in the chair, reluctant but agreeable to mobility. Required minA to stand with c/o LLE pain upon standing but no c/o throughout ambulation. Pt back to the chair after session with minimal drainage from LLE, RN aware. Pt has call bell and phone in reach.   [facility] [place] [name] Mobility Specialist Mobility Specialist [address]: [phone]

## 2021-11-22 NOTE — Discharge Instructions (Signed)
 [facility]  Discharge instructions  Lower Extremity Bypass Surgery  Please refer to the following instruction for your post-procedure care. Your surgeon or physician assistant will discuss any changes with you.  Activity  You are encouraged to walk as much as you can. You can slowly return to normal activities during the month after your surgery. Avoid strenuous activity and heavy lifting until your doctor tells you it's OK. Avoid activities such as vacuuming or swinging a golf club. Do not drive until your doctor give the OK and you are no longer taking prescription pain medications. It is also normal to have difficulty with sleep habits, eating and bowel movement after surgery. These will go away with time.  Bathing/Showering  Shower daily after you go home. Do not soak in a bathtub, hot tub, or swim until the incision heals completely.  Incision Care  Clean your incision with mild soap and water. Shower every day. Pat the area dry with a clean towel. You do not need a bandage unless otherwise instructed. Do not apply any ointments or creams to your incision. If you have open wounds you will be instructed how to care for them or a visiting nurse may be arranged for you. If you have staples or sutures along your incision they will be removed at your post-op appointment. You may have skin glue on your incision. Do not peel it off. It will come off on its own in about one week.  Wash the groin wound with soap and water daily and pat dry. (No tub bath-only shower)  Then put a dry gauze or washcloth in the groin to keep this area dry to help prevent wound infection.  Do this daily and as needed.  Do not use Vaseline or neosporin on your incisions.  Only use soap and water on your incisions and then protect and keep dry.  Diet  Resume your normal diet. There are no special food restrictions following this procedure. A low fat/ low cholesterol diet is  recommended for all patients with vascular disease. In order to heal from your surgery, it is CRITICAL to get adequate nutrition. Your body requires vitamins, minerals, and protein. Vegetables are the best source of vitamins and minerals. Vegetables also provide the perfect balance of protein. Processed food has little nutritional value, so try to avoid this.  Medications  Resume taking all your medications unless your doctor or physician assistant tells you not to. If your incision is causing pain, you may take over-the-counter pain relievers such as acetaminophen (Tylenol). If you were prescribed a stronger pain medication, please aware these medication can cause nausea and constipation. Prevent nausea by taking the medication with a snack or meal. Avoid constipation by drinking plenty of fluids and eating foods with high amount of fiber, such as fruits, vegetables, and grains. Take Colace 100 mg (an over-the-counter stool softener) twice a day as needed for constipation.  Do not take Tylenol if you are taking prescription pain medications.  Follow Up  Our office will schedule a follow up appointment 2-3 weeks following discharge.  Please call us immediately for any of the following conditions  Severe or worsening pain in your legs or feet while at rest or while walking Increase pain, redness, warmth, or drainage (pus) from your incision site(s) Fever of 101 degree or higher The swelling in your leg with the bypass suddenly worsens and becomes more painful than when you were in the hospital If you have   been instructed to feel your graft pulse then you should do so every day. If you can no longer feel this pulse, call the office immediately. Not all patients are given this instruction.  Leg swelling is common after leg bypass surgery.  The swelling should improve over a few months following surgery. To improve the swelling, you may elevate your legs above the level of your heart while you are  sitting or resting. Your surgeon or physician assistant may ask you to apply an ACE wrap or wear compression (TED) stockings to help to reduce swelling.  Reduce your risk of vascular disease  Stop smoking. If you would like help call [facility] at 1-800-QUIT-NOW (1-800-784-8669) or [place] at 336-586-4000.  Manage your cholesterol Maintain a desired weight Control your diabetes weight Control your diabetes Keep your blood pressure down  If you have any questions, please call the office at [phone]  

## 2021-11-23 MED ORDER — OXYCODONE-ACETAMINOPHEN 5-325 MG PO TABS
1.0000 | ORAL_TABLET | Freq: Four times a day (QID) | ORAL | 0 refills | Status: DC | PRN
Start: 2021-11-23 — End: 2021-12-11

## 2021-11-23 NOTE — Progress Notes (Addendum)
  Progress Note    11/23/2021 7:50 AM 3 Days Post-Op  Subjective:  Patient says he feels a little sore but he is ready to go home    Vitals:   11/23/21 0325 11/23/21 0746  BP: (!) 88/59 (!) 102/53  Pulse: 94 85  Resp: 18 14  Temp: 97.8 F (36.6 C) (!) 97.3 F (36.3 C)  SpO2: 99% 100%    Physical Exam: Lungs:  nonlabored Incisions:  LLE groin and thigh incisions c/d/l. Groin ecchymosis resolving, no evidence of hematoma. Distal LLE incision with some clear drainage but no dehiscence, erythema, or pus. Have redressed the incision to keep it dry. Extremities:  Brisk left DP/PT/Peroneal doppler signals  CBC    Component Value Date/Time   WBC 12.4 (H) 11/22/2021 0806   RBC 3.14 (L) 11/22/2021 0806   HGB 9.5 (L) 11/22/2021 0806   HCT 28.0 (L) 11/22/2021 0806   PLT 110 (L) 11/22/2021 0806   MCV 89.2 11/22/2021 0806   MCH 30.3 11/22/2021 0806   MCHC 33.9 11/22/2021 0806   RDW 14.2 11/22/2021 0806   LYMPHSABS 1.5 08/07/2020 1328   MONOABS 0.6 08/07/2020 1328   EOSABS 0.1 08/07/2020 1328   BASOSABS 0.0 08/07/2020 1328    BMET    Component Value Date/Time   NA 137 11/21/2021 0600   K 4.6 11/21/2021 0600   CL 109 11/21/2021 0600   CO2 23 11/21/2021 0600   GLUCOSE 129 (H) 11/21/2021 0600   BUN 12 11/21/2021 0600   CREATININE 0.83 11/21/2021 0600   CALCIUM 8.0 (L) 11/21/2021 0600   GFRNONAA >60 11/21/2021 0600   GFRAA  09/19/2008 1157    >60        The eGFR has been calculated using the MDRD equation. This calculation has not been validated in all clinical situations. eGFR's persistently <60 mL/min signify possible Chronic Kidney Disease.    INR    Component Value Date/Time   INR 1.1 11/18/2021 1409     Intake/Output Summary (Last 24 hours) at 11/23/2021 0750 Last data filed at 11/22/2021 1245 Gross per 24 hour  Intake 480 ml  Output --  Net 480 ml     Assessment/Plan:  87 y.o. male is 3 days post op, s/p: 1.  Harvest of left leg great saphenous  vein 2.  Left common femoral endarterectomy with bovine pericardial patch angioplasty 3.  Vein patch angioplasty of the left posterior tibial artery in the mid calf using great saphenous vein 4.  Left common femoral to posterior tibial artery bypass with 6 mm ringed PTFE    -Left leg well perfused with brisk doppler signals of DP/PT/Peroneal -LLE incisions c/d/l. Can keep gauze on most distal LLE incision for some serous drainage. -No hematoma of L groin -Patient's BP has been soft since 8/4. Home BP meds have been held. Patient will need to follow up with PCP for BP management. Patient is currently asymptomatic. -Will place orders for discharge today. Patient requires face to face for HHPT. Have placed those orders as well. -Will arrange follow up with our office in 2 weeks    Vicente Serene, PA-C [facility] [phone] 11/23/2021 7:50 AM  I have interviewed the patient and examined the patient. I agree with the findings by the PA. Brisk dopplers signals in the left foot. Home today.   Gae Gallop, MD

## 2021-11-25 ENCOUNTER — Telehealth: Payer: Self-pay | Admitting: Vascular Surgery

## 2021-11-25 DIAGNOSIS — I119 Hypertensive heart disease without heart failure: Secondary | ICD-10-CM | POA: Diagnosis not present

## 2021-11-25 DIAGNOSIS — E785 Hyperlipidemia, unspecified: Secondary | ICD-10-CM | POA: Diagnosis not present

## 2021-11-25 DIAGNOSIS — Z7982 Long term (current) use of aspirin: Secondary | ICD-10-CM | POA: Diagnosis not present

## 2021-11-25 DIAGNOSIS — C20 Malignant neoplasm of rectum: Secondary | ICD-10-CM | POA: Diagnosis not present

## 2021-11-25 DIAGNOSIS — Z951 Presence of aortocoronary bypass graft: Secondary | ICD-10-CM | POA: Diagnosis not present

## 2021-11-25 DIAGNOSIS — Z48812 Encounter for surgical aftercare following surgery on the circulatory system: Secondary | ICD-10-CM | POA: Diagnosis not present

## 2021-11-25 DIAGNOSIS — I2510 Atherosclerotic heart disease of native coronary artery without angina pectoris: Secondary | ICD-10-CM | POA: Diagnosis not present

## 2021-11-25 DIAGNOSIS — I739 Peripheral vascular disease, unspecified: Secondary | ICD-10-CM | POA: Diagnosis not present

## 2021-11-25 DIAGNOSIS — Z933 Colostomy status: Secondary | ICD-10-CM | POA: Diagnosis not present

## 2021-11-25 NOTE — Telephone Encounter (Signed)
-----   Message from [facility], [place] sent at 11/23/2021  8:10 AM EDT ----- S/p left common femoral endarterectomy and left femoral-PT bypass, 8/2  Follow up in office in 2 weeks with PA for incision check

## 2021-11-26 DIAGNOSIS — E785 Hyperlipidemia, unspecified: Secondary | ICD-10-CM | POA: Diagnosis not present

## 2021-11-26 DIAGNOSIS — I119 Hypertensive heart disease without heart failure: Secondary | ICD-10-CM | POA: Diagnosis not present

## 2021-11-26 DIAGNOSIS — I739 Peripheral vascular disease, unspecified: Secondary | ICD-10-CM | POA: Diagnosis not present

## 2021-11-26 DIAGNOSIS — C20 Malignant neoplasm of rectum: Secondary | ICD-10-CM | POA: Diagnosis not present

## 2021-11-26 DIAGNOSIS — Z48812 Encounter for surgical aftercare following surgery on the circulatory system: Secondary | ICD-10-CM | POA: Diagnosis not present

## 2021-11-26 DIAGNOSIS — I2510 Atherosclerotic heart disease of native coronary artery without angina pectoris: Secondary | ICD-10-CM | POA: Diagnosis not present

## 2021-11-28 DIAGNOSIS — I739 Peripheral vascular disease, unspecified: Secondary | ICD-10-CM | POA: Diagnosis not present

## 2021-11-28 DIAGNOSIS — Z48812 Encounter for surgical aftercare following surgery on the circulatory system: Secondary | ICD-10-CM | POA: Diagnosis not present

## 2021-11-28 DIAGNOSIS — E785 Hyperlipidemia, unspecified: Secondary | ICD-10-CM | POA: Diagnosis not present

## 2021-11-28 DIAGNOSIS — I119 Hypertensive heart disease without heart failure: Secondary | ICD-10-CM | POA: Diagnosis not present

## 2021-11-28 DIAGNOSIS — C20 Malignant neoplasm of rectum: Secondary | ICD-10-CM | POA: Diagnosis not present

## 2021-11-28 DIAGNOSIS — I2510 Atherosclerotic heart disease of native coronary artery without angina pectoris: Secondary | ICD-10-CM | POA: Diagnosis not present

## 2021-11-30 NOTE — Discharge Summary (Signed)
Discharge Summary     [name] 25-Mar-1935 87 y.o. male  [id number]  Admission Date: 11/20/2021  Discharge Date: 11/23/2021 Physician: No att. providers found  Admission Diagnosis: Peripheral artery disease ([place]) [I73.9]  HPI:   This is a 87 y.o. male who is here with a several month history of ulceration on the medial aspect of his fifth toe and lateral aspect of his fourth toe, on his left foot. These wounds have been slow to heal. He does not have claudication or rest pain. Angiogram revealed multilevel occlusive disease and high-grade stenosis of the left common femoral artery.  Hospital Course:  The patient was admitted to the hospital and taken to the operating room on 11/20/2021 and underwent: 1.  Harvest of left leg great saphenous vein 2.  Left common femoral endarterectomy with bovine pericardial patch angioplasty 3.  Vein patch angioplasty of the left posterior tibial artery in the mid calf using great saphenous vein 4.  Left common femoral to posterior tibial artery bypass with 6 mm ringed PTFE  The pt tolerated the procedure well and was transported to the PACU in fair condition.   By POD 1, the left leg was well perfused with brisk doppler signals in the PT/DP/Peroneal arteries. The bypass remained patent throughout hospitalization.   The patient improved in mobility, diet, and strength throughout his hospitalization without issue.  Patient was discharged home with home health PT on 11/23/2021.   CBC    Component Value Date/Time   WBC 12.4 (H) 11/22/2021 0806   RBC 3.14 (L) 11/22/2021 0806   HGB 9.5 (L) 11/22/2021 0806   HCT 28.0 (L) 11/22/2021 0806   PLT 110 (L) 11/22/2021 0806   MCV 89.2 11/22/2021 0806   MCH 30.3 11/22/2021 0806   MCHC 33.9 11/22/2021 0806   RDW 14.2 11/22/2021 0806   LYMPHSABS 1.5 08/07/2020 1328   MONOABS 0.6 08/07/2020 1328   EOSABS 0.1 08/07/2020 1328   BASOSABS 0.0 08/07/2020 1328    BMET    Component Value Date/Time    NA 137 11/21/2021 0600   K 4.6 11/21/2021 0600   CL 109 11/21/2021 0600   CO2 23 11/21/2021 0600   GLUCOSE 129 (H) 11/21/2021 0600   BUN 12 11/21/2021 0600   CREATININE 0.83 11/21/2021 0600   CALCIUM 8.0 (L) 11/21/2021 0600   GFRNONAA >60 11/21/2021 0600   GFRAA  09/19/2008 1157    >60        The eGFR has been calculated using the MDRD equation. This calculation has not been validated in all clinical situations. eGFR's persistently <60 mL/min signify possible Chronic Kidney Disease.     Discharge Instructions     Call MD for:  redness, tenderness, or signs of infection (pain, swelling, redness, odor or green/yellow discharge around incision site)   Complete by: As directed    Call MD for:  severe uncontrolled pain   Complete by: As directed    Call MD for:  temperature >100.4   Complete by: As directed    Diet - low sodium heart healthy   Complete by: As directed    Discharge wound care:   Complete by: As directed    Keep incisions clean and dry. Wash with antibacterial soap and warm water, pat dry. Can put gauze on incision if there is drainage.   Increase activity slowly   Complete by: As directed        Discharge Diagnosis:  Peripheral artery disease ([place]) [I73.9]  Secondary Diagnosis: Patient  Active Problem List   Diagnosis Date Noted   Peripheral artery disease ([place]) 11/20/2021   Benign prostatic hyperplasia with urinary obstruction 01/27/2020   Urinary retention 01/27/2020   CAD, ARTERY BYPASS GRAFT 04/25/2009   HYPERLIPIDEMIA 04/24/2009   HYPERTENSION 04/24/2009   Past Medical History:  Diagnosis Date   CAD (coronary artery disease)    Status post CABG 1999   Essential hypertension    Hyperlipidemia    Rectal cancer (HCC)      Allergies as of 11/23/2021   No Known Allergies      Medication List     TAKE these medications    amLODipine 5 MG tablet Commonly known as: NORVASC Take 5 mg by mouth daily.   aspirin EC 81 MG tablet Take 81  mg by mouth daily. Swallow whole.   atorvastatin 10 MG tablet Commonly known as: LIPITOR Take 10 mg by mouth daily.   fish oil-omega-3 fatty acids 1000 MG capsule Take 1-2 g by mouth See admin instructions.  Take 2 g in the morning and 1 g at bedtime   folic acid 803 MCG tablet Commonly known as: FOLVITE Take 400 mcg by mouth daily.   losartan 50 MG tablet Commonly known as: COZAAR Take 50 mg by mouth daily.   multivitamin tablet Take 1 tablet by mouth 3 (three) times a week.   Opcon-A 0.027-0.315 % Soln Generic drug: Naphazoline-Pheniramine Place 1 drop into both eyes daily as needed (Burning eyes).   oxyCODONE-acetaminophen 5-325 MG tablet Commonly known as: PERCOCET/ROXICET Take 1-2 tablets by mouth every 6 (six) hours as needed for moderate pain.               Discharge Care Instructions  (From admission, onward)           Start     Ordered   11/23/21 0000  Discharge wound care:       Comments: Keep incisions clean and dry. Wash with antibacterial soap and warm water, pat dry. Can put gauze on incision if there is drainage.   11/23/21 0809            Discharge Instructions: [facility] Discharge instructions Lower Extremity Bypass Surgery  Please refer to the following instruction for your post-procedure care. Your surgeon or physician assistant will discuss any changes with you.  Activity  You are encouraged to walk as much as you can. You can slowly return to normal activities during the month after your surgery. Avoid strenuous activity and heavy lifting until your doctor tells you it's OK. Avoid activities such as vacuuming or swinging a golf club. Do not drive until your doctor give the OK and you are no longer taking prescription pain medications. It is also normal to have difficulty with sleep habits, eating and bowel movement after surgery. These will go away with time.  Bathing/Showering  You may shower  after you go home. Do not soak in a bathtub, hot tub, or swim until the incision heals completely.  Incision Care  Clean your incision with mild soap and water. Shower every day. Pat the area dry with a clean towel. You do not need a bandage unless otherwise instructed. Do not apply any ointments or creams to your incision. If you have open wounds you will be instructed how to care for them or a visiting nurse may be arranged for you. If you have staples or sutures along your incision they will be removed at your post-op appointment. You  may have skin glue on your incision. Do not peel it off. It will come off on its own in about one week.  Wash the groin wound with soap and water daily and pat dry. (No tub bath-only shower)  Then put a dry gauze or washcloth in the groin to keep this area dry to help prevent wound infection.  Do this daily and as needed.  Do not use Vaseline or neosporin on your incisions.  Only use soap and water on your incisions and then protect and keep dry.  Diet  Resume your normal diet. There are no special food restrictions following this procedure. A low fat/ low cholesterol diet is recommended for all patients with vascular disease. In order to heal from your surgery, it is CRITICAL to get adequate nutrition. Your body requires vitamins, minerals, and protein. Vegetables are the best source of vitamins and minerals. Vegetables also provide the perfect balance of protein. Processed food has little nutritional value, so try to avoid this.  Medications  Resume taking all your medications unless your doctor or Physician Assistant tells you not to. If your incision is causing pain, you may take over-the-counter pain relievers such as acetaminophen (Tylenol). If you were prescribed a stronger pain medication, please aware these medication can cause nausea and constipation. Prevent nausea by taking the medication with a snack or meal. Avoid constipation by drinking plenty of fluids  and eating foods with high amount of fiber, such as fruits, vegetables, and grains. Take Colace 100 mg (an over-the-counter stool softener) twice a day as needed for constipation.  Do not take Tylenol if you are taking prescription pain medications.  Follow Up  Our office will schedule a follow up appointment 2-3 weeks following discharge.  Please call us immediately for any of the following conditions  Severe or worsening pain in your legs or feet while at rest or while walking Increase pain, redness, warmth, or drainage (pus) from your incision site(s) Fever of 101 degree or higher The swelling in your leg with the bypass suddenly worsens and becomes more painful than when you were in the hospital If you have been instructed to feel your graft pulse then you should do so every day. If you can no longer feel this pulse, call the office immediately. Not all patients are given this instruction.  Leg swelling is common after leg bypass surgery.  The swelling should improve over a few months following surgery. To improve the swelling, you may elevate your legs above the level of your heart while you are sitting or resting. Your surgeon or physician assistant may ask you to apply an ACE wrap or wear compression (TED) stockings to help to reduce swelling.  Reduce your risk of vascular disease  Stop smoking. If you would like help call [facility] at 1-800-QUIT-NOW 763-162-9376) or [place] at 812-651-1360.  Manage your cholesterol Maintain a desired weight Control your diabetes weight Control your diabetes Keep your blood pressure down  If you have any questions, please call the office at [phone]   Disposition: Home  Patient's condition: is Good  Follow up: 1. [facility] in 2-3 weeks   [name], [place] [facility] [phone] 11/30/2021  6:49 PM  - For VQI Registry use ---   Post-op:  Wound infection: No  Graft infection: No  Transfusion: Yes    If  yes, 1 units given New Arrhythmia: No Ipsilateral amputation: No, [ ]  Minor, [ ]  BKA, [ ]  AKA Discharge patency: [x ]  Primary, [ ]  Primary assisted, [ ]  Secondary, [ ]  Occluded Patency judged by: [x ] Dopper only, [ ]  Palpable graft pulse, []  Palpable distal pulse, [ ]  ABI inc. > 0.15, [ ]  Duplex D/C Ambulatory Status: Ambulatory with Assistance  Complications: MI: No, [ ]  Troponin only, [ ]  EKG or Clinical CHF: No Resp failure:No, [ ]  Pneumonia, [ ]  Ventilator Chg in renal function: No, [ ]  Inc. Cr > 0.5, [ ]  Temp. Dialysis,  [ ]  Permanent dialysis Stroke: No, [ ]  Minor, [ ]  Major Return to OR: No  Reason for return to OR: [ ]  Bleeding, [ ]  Infection, [ ]  Thrombosis, [ ]  Revision  Discharge medications: Statin use:  yes ASA use:  yes Plavix use:  no Beta blocker use: no CCB use:  Yes ACEI use:   no ARB use:  yes Coumadin use: no

## 2021-12-02 DIAGNOSIS — E785 Hyperlipidemia, unspecified: Secondary | ICD-10-CM | POA: Diagnosis not present

## 2021-12-02 DIAGNOSIS — I739 Peripheral vascular disease, unspecified: Secondary | ICD-10-CM | POA: Diagnosis not present

## 2021-12-02 DIAGNOSIS — I2510 Atherosclerotic heart disease of native coronary artery without angina pectoris: Secondary | ICD-10-CM | POA: Diagnosis not present

## 2021-12-02 DIAGNOSIS — Z48812 Encounter for surgical aftercare following surgery on the circulatory system: Secondary | ICD-10-CM | POA: Diagnosis not present

## 2021-12-02 DIAGNOSIS — I119 Hypertensive heart disease without heart failure: Secondary | ICD-10-CM | POA: Diagnosis not present

## 2021-12-02 DIAGNOSIS — C20 Malignant neoplasm of rectum: Secondary | ICD-10-CM | POA: Diagnosis not present

## 2021-12-04 ENCOUNTER — Ambulatory Visit (INDEPENDENT_AMBULATORY_CARE_PROVIDER_SITE_OTHER): Payer: Medicare Other | Admitting: Urology

## 2021-12-04 DIAGNOSIS — R188 Other ascites: Secondary | ICD-10-CM | POA: Diagnosis not present

## 2021-12-04 NOTE — Progress Notes (Unsigned)
Assessment: 1. Groin fluid collection - Urinalysis, Routine w reflex microscopic    Plan: Immediately following examination, Dr. Alyson Ingles is consulted for exam and cystoscopy/foley placement.  Chief Complaint: No chief complaint on file.   HPI: [name] is a 87 y.o. male who presents for evaluation of genital and groin swelling since foley placement on 11/20/21 during vascular procedure for treatment of left common femoral artery stenosis. The pt reports onset of swelling and urine dribbling following foley removal and sxs have progressively worsened. He is now wearing pads at all times for incontinence. Pt states he has been doing well post Urolift on 08/09/20 until this foley placement. No fever, chills, abdominal pain. No gross hematuria. Pt straining to void only drops at this time. Previous James s and hospital admission notes reviewed during office visit.  UA = clear Portions of the above documentation were copied from a prior visit for review purposes only.  Allergies: No Known Allergies  PMH: Past Medical History:  Diagnosis Date   CAD (coronary artery disease)    Status post CABG 1999   Essential hypertension    Hyperlipidemia    Rectal cancer (HCC)     PSH: Past Surgical History:  Procedure Laterality Date   ABDOMINAL AORTOGRAM W/LOWER EXTREMITY N/A 11/07/2021   Procedure: ABDOMINAL AORTOGRAM W/LOWER EXTREMITY;  Surgeon: [name], MD;  Location: [facility];  Service: Cardiovascular;  Laterality: N/A;   CATARACT EXTRACTION W/PHACO Left 06/25/2015   Procedure: CATARACT EXTRACTION PHACO AND INTRAOCULAR LENS PLACEMENT; CDE:  4.69;  Surgeon: Williams Che, MD;  Location: [facility];  Service: Ophthalmology;  Laterality: Left;   COLOSTOMY     CORONARY ARTERY BYPASS GRAFT  1999   CYSTOSCOPY WITH INSERTION OF UROLIFT N/A 08/09/2020   Procedure: CYSTOSCOPY WITH INSERTION OF UROLIFT;  Surgeon: Cleon Gustin, MD;  Location: [facility];  Service:  Urology;  Laterality: N/A;   ENDARTERECTOMY FEMORAL Left 11/20/2021   Procedure: LEFT COMMON FEMORAL ENDARTERECTOMY WITH PATCH ANGIOPLASTY USING 1 CM X 6 CM XENOSURE BIOLOGIC PATCH;  Surgeon: [name], MD;  Location: [facility];  Service: Vascular;  Laterality: Left;   FEMORAL-TIBIAL BYPASS GRAFT Left 11/20/2021   Procedure: HARVEST LEFT LEG GREATER SAPHENOUS VEIN, VEIN PATCH OF POSTERIOR TIBIAL ANGIOPLASTY. LEFT FEMORAL-POSTERIOR TIBIAL BYPASS GRAFT USING 6 MMX 80 CM PROPATEN GORE GRAFT WITH RING. ;  Surgeon: [name], MD;  Location: [facility];  Service: Vascular;  Laterality: Left;  INSERT ARTERIAL LINE   RECTAL SURGERY      SH: Social History   Tobacco Use   Smoking status: Never   Smokeless tobacco: Current    Types: Chew  Vaping Use   Vaping Use: Never used  Substance Use Topics   Alcohol use: No   Drug use: No    ROS: All other review of systems were reviewed and are negative except what is noted above in HPI  PE: There were no vitals taken for this visit. GENERAL APPEARANCE:  Well appearing, well developed, well nourished, NAD HEENT:  Atraumatic, normocephalic NECK:  Supple. Trachea midline ABDOMEN:  Soft, non-tender, no masses GU: Penile tissue and pubic area swollen and tense with urine production with palpation of the penis. No erythema, tenderness EXTREMITIES:  Moves all extremities well MENTAL STATUS:  appropriate SKIN:  Warm, dry, and intact   Results: Laboratory Data: Lab Results  Component Value Date   WBC 12.4 (H) 11/22/2021   HGB 9.5 (L) 11/22/2021   HCT  28.0 (L) 11/22/2021   MCV 89.2 11/22/2021   PLT 110 (L) 11/22/2021    Lab Results  Component Value Date   CREATININE 0.83 11/21/2021     Urinalysis    Component Value Date/Time   COLORURINE YELLOW 11/18/2021 1436   APPEARANCEUR HAZY (A) 11/18/2021 1436   APPEARANCEUR Clear 05/24/2020 0941   LABSPEC 1.013 11/18/2021 1436   PHURINE 6.0 11/18/2021 1436   GLUCOSEU NEGATIVE  11/18/2021 1436   HGBUR NEGATIVE 11/18/2021 1436   BILIRUBINUR NEGATIVE 11/18/2021 1436   BILIRUBINUR Negative 05/24/2020 [place] 11/18/2021 1436   PROTEINUR NEGATIVE 11/18/2021 1436   NITRITE NEGATIVE 11/18/2021 1436   LEUKOCYTESUR NEGATIVE 11/18/2021 1436    Lab Results  Component Value Date   LABMICR See below: 05/24/2020   WBCUA 6-10 (A) 05/24/2020   LABEPIT None seen 05/24/2020   MUCUS Present 05/22/2020   BACTERIA Many (A) 05/24/2020    Pertinent Imaging: No results found for this or any previous visit.  No results found for this or any previous visit.  No results found for this or any previous visit.  No results found for this or any previous visit.  No results found for this or any previous visit.  No results found for this or any previous visit.  No results found for this or any previous visit.  No results found for this or any previous visit.  No results found for this or any previous visit (from the past 24 hour(s)).      CC: penile edema   HPI: [name] is a 87yo here with penle edema and concern for urethral injury There were no vitals taken for this visit. NED. A&Ox3.   No respiratory distress   Abd soft, NT, ND Normal phallus with bilateral descended testicles  Cystoscopy Procedure Note  Patient identification was confirmed, informed consent was obtained, and patient was prepped using Betadine solution.  Lidocaine jelly was administered per urethral meatus.     Pre-Procedure: - Inspection reveals a normal caliber ureteral meatus.  Procedure: The flexible cystoscope was introduced without difficulty - large false passage in distal penile urethra - Enlarged prostate  - Normal bladder neck - Bilateral ureteral orifices identified - Bladder mucosa  reveals no ulcers, tumors, or lesions - No bladder stones - No trabeculation  Wire placed across the injury and a 16 french foley was placed. Wire was removed.     Post-Procedure: - Patient tolerated the procedure well  Assessment/ Plan: RTC 1 week for a voiding trial  No follow-ups on file.  Nicolette Bang, MD

## 2021-12-05 LAB — URINALYSIS, ROUTINE W REFLEX MICROSCOPIC
Bilirubin, UA: NEGATIVE
Glucose, UA: NEGATIVE
Ketones, UA: NEGATIVE
Leukocytes,UA: NEGATIVE
Nitrite, UA: NEGATIVE
Protein,UA: NEGATIVE
RBC, UA: NEGATIVE
Specific Gravity, UA: 1.01 (ref 1.005–1.030)
Urobilinogen, Ur: 4 mg/dL — ABNORMAL HIGH (ref 0.2–1.0)
pH, UA: 6 (ref 5.0–7.5)

## 2021-12-06 DIAGNOSIS — E785 Hyperlipidemia, unspecified: Secondary | ICD-10-CM | POA: Diagnosis not present

## 2021-12-06 DIAGNOSIS — I119 Hypertensive heart disease without heart failure: Secondary | ICD-10-CM | POA: Diagnosis not present

## 2021-12-06 DIAGNOSIS — I739 Peripheral vascular disease, unspecified: Secondary | ICD-10-CM | POA: Diagnosis not present

## 2021-12-06 DIAGNOSIS — I2510 Atherosclerotic heart disease of native coronary artery without angina pectoris: Secondary | ICD-10-CM | POA: Diagnosis not present

## 2021-12-06 DIAGNOSIS — Z48812 Encounter for surgical aftercare following surgery on the circulatory system: Secondary | ICD-10-CM | POA: Diagnosis not present

## 2021-12-06 DIAGNOSIS — C20 Malignant neoplasm of rectum: Secondary | ICD-10-CM | POA: Diagnosis not present

## 2021-12-09 DIAGNOSIS — C20 Malignant neoplasm of rectum: Secondary | ICD-10-CM | POA: Diagnosis not present

## 2021-12-09 DIAGNOSIS — I2510 Atherosclerotic heart disease of native coronary artery without angina pectoris: Secondary | ICD-10-CM | POA: Diagnosis not present

## 2021-12-09 DIAGNOSIS — I739 Peripheral vascular disease, unspecified: Secondary | ICD-10-CM | POA: Diagnosis not present

## 2021-12-09 DIAGNOSIS — E785 Hyperlipidemia, unspecified: Secondary | ICD-10-CM | POA: Diagnosis not present

## 2021-12-09 DIAGNOSIS — I119 Hypertensive heart disease without heart failure: Secondary | ICD-10-CM | POA: Diagnosis not present

## 2021-12-09 DIAGNOSIS — Z48812 Encounter for surgical aftercare following surgery on the circulatory system: Secondary | ICD-10-CM | POA: Diagnosis not present

## 2021-12-10 ENCOUNTER — Ambulatory Visit: Payer: Medicare Other

## 2021-12-10 NOTE — Progress Notes (Deleted)
  POST OPERATIVE OFFICE NOTE    CC:  F/u for surgery  HPI:  This is a 87 y.o. male who is s/p Left common femoral endarterectomy with bovine pericardial patch angioplasty, Vein patch angioplasty of the left posterior tibial artery in the mid calf using great saphenous vein and  Left common femoral to posterior tibial artery bypass with 6 mm ringed PTFE on 11/23/21 by Dr. Carlis Abbott.    Pt returns today for follow up.  Pt states ***   No Known Allergies  Current Outpatient Medications  Medication Sig Dispense Refill   amLODipine (NORVASC) 5 MG tablet Take 5 mg by mouth daily.     aspirin EC 81 MG tablet Take 81 mg by mouth daily. Swallow whole.     atorvastatin (LIPITOR) 10 MG tablet Take 10 mg by mouth daily.     fish oil-omega-3 fatty acids 1000 MG capsule Take 1-2 g by mouth See admin instructions.  Take 2 g in the morning and 1 g at bedtime     folic acid (FOLVITE) 032 MCG tablet Take 400 mcg by mouth daily.     losartan (COZAAR) 50 MG tablet Take 50 mg by mouth daily.     Multiple Vitamin (MULTIVITAMIN) tablet Take 1 tablet by mouth 3 (three) times a week.     Naphazoline-Pheniramine (OPCON-A) 0.027-0.315 % SOLN Place 1 drop into both eyes daily as needed (Burning eyes).     oxyCODONE-acetaminophen (PERCOCET/ROXICET) 5-325 MG tablet Take 1-2 tablets by mouth every 6 (six) hours as needed for moderate pain. 15 tablet 0   No current facility-administered medications for this visit.     ROS:  See HPI  Physical Exam:  ***  Incision:  *** Extremities:  *** Neuro: *** Abdomen:  ***    Assessment/Plan:  This is a 87 y.o. male who is s/p: ***  -***   Leontine Locket, [facility] [phone]   Clinic MD:  ***

## 2021-12-11 ENCOUNTER — Ambulatory Visit (INDEPENDENT_AMBULATORY_CARE_PROVIDER_SITE_OTHER): Payer: Medicare Other | Admitting: Physician Assistant

## 2021-12-11 VITALS — BP 136/56 | HR 99

## 2021-12-11 VITALS — BP 149/67 | HR 86 | Temp 98.2°F | Ht 68.0 in | Wt 135.2 lb

## 2021-12-11 DIAGNOSIS — R339 Retention of urine, unspecified: Secondary | ICD-10-CM | POA: Diagnosis not present

## 2021-12-11 DIAGNOSIS — L97529 Non-pressure chronic ulcer of other part of left foot with unspecified severity: Secondary | ICD-10-CM

## 2021-12-11 DIAGNOSIS — R188 Other ascites: Secondary | ICD-10-CM

## 2021-12-11 DIAGNOSIS — N138 Other obstructive and reflux uropathy: Secondary | ICD-10-CM

## 2021-12-11 DIAGNOSIS — N401 Enlarged prostate with lower urinary tract symptoms: Secondary | ICD-10-CM

## 2021-12-11 DIAGNOSIS — I70222 Atherosclerosis of native arteries of extremities with rest pain, left leg: Secondary | ICD-10-CM

## 2021-12-11 MED ORDER — TAMSULOSIN HCL 0.4 MG PO CAPS: 0.4000 mg | ORAL_CAPSULE | Freq: Every day | ORAL | 0 refills | Status: DC

## 2021-12-11 NOTE — Progress Notes (Signed)
Assessment: 1. Benign prostatic hyperplasia with urinary obstruction  2. Urinary retention  3. Groin fluid collection    Plan: FU next week for early a/m. Voiding trial. Resume Flomax HS.   Chief Complaint: No chief complaint on file.   HPI: [name] is a 87 y.o. male who presents for continued evaluation of urinary retention and fluid collection in the groin and penis. Swelling improved significantly. No pain or issues with drainage from catheter.  8/16/23[name] is a 87 y.o. male who presents for evaluation of genital and groin swelling since foley placement on 11/20/21 during vascular procedure for treatment of left common femoral artery stenosis. The pt reports onset of swelling and urine dribbling following foley removal and sxs have progressively worsened. He is now wearing pads at all times for incontinence. Pt states he has been doing well post Urolift on 08/09/20 until this foley placement. No fever, chills, abdominal pain. No gross hematuria. Pt straining to void only drops at this time. Previous MR s and hospital admission notes reviewed during office visit.  Portions of the above documentation were copied from a prior visit for review purposes only.  Allergies: No Known Allergies  PMH: Past Medical History:  Diagnosis Date   CAD (coronary artery disease)    Status post CABG 1999   Essential hypertension    Hyperlipidemia    Rectal cancer (HCC)     PSH: Past Surgical History:  Procedure Laterality Date   ABDOMINAL AORTOGRAM W/LOWER EXTREMITY N/A 11/07/2021   Procedure: ABDOMINAL AORTOGRAM W/LOWER EXTREMITY;  Surgeon: [name], MD;  Location: [place] [facility];  Service: Cardiovascular;  Laterality: N/A;   CATARACT EXTRACTION W/PHACO Left 06/25/2015   Procedure: CATARACT EXTRACTION PHACO AND INTRAOCULAR LENS PLACEMENT; CDE:  4.69;  Surgeon: Williams Che, MD;  Location: [facility];  Service: Ophthalmology;  Laterality: Left;    COLOSTOMY     CORONARY ARTERY BYPASS GRAFT  1999   CYSTOSCOPY WITH INSERTION OF UROLIFT N/A 08/09/2020   Procedure: CYSTOSCOPY WITH INSERTION OF UROLIFT;  Surgeon: Cleon Gustin, MD;  Location: [facility];  Service: Urology;  Laterality: N/A;   ENDARTERECTOMY FEMORAL Left 11/20/2021   Procedure: LEFT COMMON FEMORAL ENDARTERECTOMY WITH PATCH ANGIOPLASTY USING 1 CM X 6 CM XENOSURE BIOLOGIC PATCH;  Surgeon: [name], MD;  Location: [place];  Service: Vascular;  Laterality: Left;   FEMORAL-TIBIAL BYPASS GRAFT Left 11/20/2021   Procedure: HARVEST LEFT LEG GREATER SAPHENOUS VEIN, VEIN PATCH OF POSTERIOR TIBIAL ANGIOPLASTY. LEFT FEMORAL-POSTERIOR TIBIAL BYPASS GRAFT USING 6 MMX 80 CM PROPATEN GORE GRAFT WITH RING. ;  Surgeon: [name], MD;  Location: [place];  Service: Vascular;  Laterality: Left;  INSERT ARTERIAL LINE   RECTAL SURGERY      SH: Social History   Tobacco Use   Smoking status: Never   Smokeless tobacco: Current    Types: Chew  Vaping Use   Vaping Use: Never used  Substance Use Topics   Alcohol use: No   Drug use: No    ROS: All other review of systems were reviewed and are negative except what is noted above in HPI  PE: BP (!) 136/56   Pulse 99  GENERAL APPEARANCE:  Well appearing, well developed, well nourished, NAD HEENT:  Atraumatic, normocephalic NECK:  Supple. Trachea midline ABDOMEN:  Soft, non-tender, no masses GU: Catheter intact and urethra clear. Mild swelling still present in scrotum and penis. No erythema or tenderness. Foley draining well EXTREMITIES:  Moves all extremities well, without clubbing, cyanosis, or edema NEUROLOGIC:  Alert and oriented x 3, mobilizes with walker assist MENTAL STATUS:  appropriate SKIN:  Warm, dry, and intact   Results: Laboratory Data: Lab Results  Component Value Date   WBC 12.4 (H) 11/22/2021   HGB 9.5 (L) 11/22/2021   HCT 28.0 (L) 11/22/2021   MCV 89.2 11/22/2021   PLT 110 (L) 11/22/2021    Lab  Results  Component Value Date   CREATININE 0.83 11/21/2021    No results found for: "PSA"  No results found for: "TESTOSTERONE"  No results found for: "HGBA1C"  Urinalysis    Component Value Date/Time   COLORURINE YELLOW 11/18/2021 1436   APPEARANCEUR Hazy (A) 12/04/2021 1431   LABSPEC 1.013 11/18/2021 1436   PHURINE 6.0 11/18/2021 1436   GLUCOSEU Negative 12/04/2021 1431   HGBUR NEGATIVE 11/18/2021 1436   BILIRUBINUR Negative 12/04/2021 1431   KETONESUR NEGATIVE 11/18/2021 1436   PROTEINUR Negative 12/04/2021 1431   PROTEINUR NEGATIVE 11/18/2021 1436   NITRITE Negative 12/04/2021 1431   NITRITE NEGATIVE 11/18/2021 1436   LEUKOCYTESUR Negative 12/04/2021 1431   LEUKOCYTESUR NEGATIVE 11/18/2021 1436    Lab Results  Component Value Date   LABMICR See below: 05/24/2020   WBCUA 6-10 (A) 05/24/2020   LABEPIT None seen 05/24/2020   MUCUS Present 05/22/2020   BACTERIA Many (A) 05/24/2020    Pertinent Imaging: No results found for this or any previous visit.  No results found for this or any previous visit.  No results found for this or any previous visit.  No results found for this or any previous visit.  No results found for this or any previous visit.  No results found for this or any previous visit.  No results found for this or any previous visit.  No results found for this or any previous visit.  No results found for this or any previous visit (from the past 24 hour(s)).

## 2021-12-11 NOTE — Progress Notes (Signed)
  POST OPERATIVE OFFICE NOTE    CC:  F/u for surgery  HPI:  This is a 87 y.o. male who is s/p left common femoral endarterectomy with bovine patch angioplasty, harvest of saphenous vein for vein patch of the left PTA and common femoral to posterior tibial artery bypass with PTFE by Dr. Carlis Abbott on 11/20/2021 due to wound between the left fourth and fifth toes.  Patient states the wound between the fourth and fifth toes has improved since surgery.  He does have some swelling in the left leg however no pain at this time.  He believes the incisions are healing well.  He is ambulatory with a walker.  He is on aspirin and statin daily.  No Known Allergies  Current Outpatient Medications  Medication Sig Dispense Refill   amLODipine (NORVASC) 5 MG tablet Take 5 mg by mouth daily.     aspirin EC 81 MG tablet Take 81 mg by mouth daily. Swallow whole.     atorvastatin (LIPITOR) 10 MG tablet Take 10 mg by mouth daily.     fish oil-omega-3 fatty acids 1000 MG capsule Take 1-2 g by mouth See admin instructions.  Take 2 g in the morning and 1 g at bedtime     folic acid (FOLVITE) 660 MCG tablet Take 400 mcg by mouth daily.     losartan (COZAAR) 50 MG tablet Take 50 mg by mouth daily.     Multiple Vitamin (MULTIVITAMIN) tablet Take 1 tablet by mouth 3 (three) times a week.     Naphazoline-Pheniramine (OPCON-A) 0.027-0.315 % SOLN Place 1 drop into both eyes daily as needed (Burning eyes).     No current facility-administered medications for this visit.     ROS:  See HPI  Physical Exam:  Vitals:   12/11/21 1012  BP: (!) 149/67  Pulse: 86  Temp: 98.2 F (36.8 C)  TempSrc: Temporal  SpO2: 100%  Weight: 135 lb 3.2 oz (61.3 kg)  Height: '5\' 8"'$  (1.727 m)    Incision: Left groin and below the knee incisions are healing well Extremities: Palpable left PT pulse   Assessment/Plan:  This is a 87 y.o. male who is s/p: left common femoral endarterectomy with bovine patch angioplasty, harvest of saphenous  vein for vein patch of the left PTA and common femoral to posterior tibial artery bypass with PTFE by Dr. Carlis Abbott on 11/20/2021  -Ulceration between the left fourth and fifth toes has nearly healed since surgery, continue to keep this area dry and [place] piece of gauze between the toes if needed -Left foot is well-perfused with a palpable PT pulse suggesting widely patent bypass graft -Encourage patient to ambulate much as possible with a walker during the day -Continue aspirin and statin daily -Check bypass duplex with ABI in 3 months   Dagoberto Ligas, PA-C [facility] [phone]  Clinic MD:  Donzetta Matters

## 2021-12-12 ENCOUNTER — Encounter: Payer: Self-pay | Admitting: Urology

## 2021-12-13 DIAGNOSIS — I739 Peripheral vascular disease, unspecified: Secondary | ICD-10-CM | POA: Diagnosis not present

## 2021-12-13 DIAGNOSIS — E785 Hyperlipidemia, unspecified: Secondary | ICD-10-CM | POA: Diagnosis not present

## 2021-12-13 DIAGNOSIS — C20 Malignant neoplasm of rectum: Secondary | ICD-10-CM | POA: Diagnosis not present

## 2021-12-13 DIAGNOSIS — Z48812 Encounter for surgical aftercare following surgery on the circulatory system: Secondary | ICD-10-CM | POA: Diagnosis not present

## 2021-12-13 DIAGNOSIS — I2510 Atherosclerotic heart disease of native coronary artery without angina pectoris: Secondary | ICD-10-CM | POA: Diagnosis not present

## 2021-12-13 DIAGNOSIS — I119 Hypertensive heart disease without heart failure: Secondary | ICD-10-CM | POA: Diagnosis not present

## 2021-12-17 ENCOUNTER — Other Ambulatory Visit: Payer: Self-pay

## 2021-12-17 DIAGNOSIS — I70222 Atherosclerosis of native arteries of extremities with rest pain, left leg: Secondary | ICD-10-CM

## 2021-12-17 DIAGNOSIS — I739 Peripheral vascular disease, unspecified: Secondary | ICD-10-CM

## 2021-12-18 ENCOUNTER — Ambulatory Visit (INDEPENDENT_AMBULATORY_CARE_PROVIDER_SITE_OTHER): Payer: Medicare Other | Admitting: Urology

## 2021-12-18 ENCOUNTER — Encounter: Payer: Self-pay | Admitting: Urology

## 2021-12-18 VITALS — BP 124/52 | HR 97

## 2021-12-18 DIAGNOSIS — E785 Hyperlipidemia, unspecified: Secondary | ICD-10-CM | POA: Diagnosis not present

## 2021-12-18 DIAGNOSIS — R188 Other ascites: Secondary | ICD-10-CM

## 2021-12-18 DIAGNOSIS — R339 Retention of urine, unspecified: Secondary | ICD-10-CM | POA: Diagnosis not present

## 2021-12-18 DIAGNOSIS — I739 Peripheral vascular disease, unspecified: Secondary | ICD-10-CM | POA: Diagnosis not present

## 2021-12-18 DIAGNOSIS — I119 Hypertensive heart disease without heart failure: Secondary | ICD-10-CM | POA: Diagnosis not present

## 2021-12-18 DIAGNOSIS — C20 Malignant neoplasm of rectum: Secondary | ICD-10-CM | POA: Diagnosis not present

## 2021-12-18 DIAGNOSIS — I2510 Atherosclerotic heart disease of native coronary artery without angina pectoris: Secondary | ICD-10-CM | POA: Diagnosis not present

## 2021-12-18 DIAGNOSIS — Z48812 Encounter for surgical aftercare following surgery on the circulatory system: Secondary | ICD-10-CM | POA: Diagnosis not present

## 2021-12-18 MED ORDER — TAMSULOSIN HCL 0.4 MG PO CAPS: 0.4000 mg | ORAL_CAPSULE | Freq: Every day | ORAL | 0 refills | Status: AC

## 2021-12-18 NOTE — Progress Notes (Signed)
12/18/2021 10:33 AM   [name] 02-09-1935 [id number]  Referring provider: Celene Squibb, MD [address],  [place] [address]  Followup penile edema and urinary retention   HPI: [name] is a 87yo here for followup for penile edema and urinary retention. Penile edema resolved 2 days ago. Voiding trial passed today. No other complaints   PMH: Past Medical History:  Diagnosis Date   CAD (coronary artery disease)    Status post CABG 1999   Essential hypertension    Hyperlipidemia    Rectal cancer [facility])     Surgical History: Past Surgical History:  Procedure Laterality Date   ABDOMINAL AORTOGRAM W/LOWER EXTREMITY N/A 11/07/2021   Procedure: ABDOMINAL AORTOGRAM W/LOWER EXTREMITY;  Surgeon: [name], MD;  Location: [place] [facility];  Service: Cardiovascular;  Laterality: N/A;   CATARACT EXTRACTION W/PHACO Left 06/25/2015   Procedure: CATARACT EXTRACTION PHACO AND INTRAOCULAR LENS PLACEMENT; CDE:  4.69;  Surgeon: Williams Che, MD;  Location: [facility];  Service: Ophthalmology;  Laterality: Left;   COLOSTOMY     CORONARY ARTERY BYPASS GRAFT  1999   CYSTOSCOPY WITH INSERTION OF UROLIFT N/A 08/09/2020   Procedure: CYSTOSCOPY WITH INSERTION OF UROLIFT;  Surgeon: Cleon Gustin, MD;  [facility];  Service: Urology;  Laterality: N/A;   ENDARTERECTOMY FEMORAL Left 11/20/2021   Procedure: LEFT COMMON FEMORAL ENDARTERECTOMY WITH PATCH ANGIOPLASTY USING 1 CM X 6 CM XENOSURE BIOLOGIC PATCH;  Surgeon: [name], MD;  Location: [place];  Service: Vascular;  Laterality: Left;   FEMORAL-TIBIAL BYPASS GRAFT Left 11/20/2021   Procedure: HARVEST LEFT LEG GREATER SAPHENOUS VEIN, VEIN PATCH OF POSTERIOR TIBIAL ANGIOPLASTY. LEFT FEMORAL-POSTERIOR TIBIAL BYPASS GRAFT USING 6 MMX 80 CM PROPATEN GORE GRAFT WITH RING. ;  Surgeon: [name], MD;  Location: [place];  Service: Vascular;  Laterality: Left;  INSERT ARTERIAL LINE   RECTAL SURGERY       Home Medications:  Allergies as of 12/18/2021   No Known Allergies      Medication List        Accurate as of December 18, 2021 10:33 AM. If you have any questions, ask your nurse or doctor.          amLODipine 5 MG tablet Commonly known as: NORVASC Take 5 mg by mouth daily.   aspirin EC 81 MG tablet Take 81 mg by mouth daily. Swallow whole.   atorvastatin 10 MG tablet Commonly known as: LIPITOR Take 10 mg by mouth daily.   fish oil-omega-3 fatty acids 1000 MG capsule Take 1-2 g by mouth See admin instructions.  Take 2 g in the morning and 1 g at bedtime   folic acid 509 MCG tablet Commonly known as: FOLVITE Take 400 mcg by mouth daily.   losartan 50 MG tablet Commonly known as: COZAAR Take 50 mg by mouth daily.   multivitamin tablet Take 1 tablet by mouth 3 (three) times a week.   Opcon-A 0.027-0.315 % Soln Generic drug: Naphazoline-Pheniramine Place 1 drop into both eyes daily as needed (Burning eyes).   tamsulosin 0.4 MG Caps capsule Commonly known as: FLOMAX Take 1 capsule (0.4 mg total) by mouth daily.        Allergies: No Known Allergies  Family History: Family History  Problem Relation Age of Onset   Alcoholism Father    Diabetes Mellitus II Father     Social History:  reports that he has never smoked. His smokeless tobacco  use includes chew. He reports that he does not drink alcohol and does not use drugs.  ROS: All other review of systems were reviewed and are negative except what is noted above in HPI  Physical Exam: BP (!) 124/52   Pulse 97   Constitutional:  Alert and oriented, No acute distress. HEENT: [place] AT, moist mucus membranes.  Trachea midline, no masses. Cardiovascular: No clubbing, cyanosis, or edema. Respiratory: Normal respiratory effort, no increased work of breathing. GI: Abdomen is soft, nontender, nondistended, no abdominal masses GU: No CVA tenderness.  Lymph: No cervical or inguinal lymphadenopathy. Skin: No  rashes, bruises or suspicious lesions. Neurologic: Grossly intact, no focal deficits, moving all 4 extremities. Psychiatric: Normal mood and affect.  Laboratory Data: Lab Results  Component Value Date   WBC 12.4 (H) 11/22/2021   HGB 9.5 (L) 11/22/2021   HCT 28.0 (L) 11/22/2021   MCV 89.2 11/22/2021   PLT 110 (L) 11/22/2021    Lab Results  Component Value Date   CREATININE 0.83 11/21/2021    No results found for: "PSA"  No results found for: "TESTOSTERONE"  No results found for: "HGBA1C"  Urinalysis    Component Value Date/Time   COLORURINE YELLOW 11/18/2021 1436   APPEARANCEUR Hazy (A) 12/04/2021 1431   LABSPEC 1.013 11/18/2021 1436   PHURINE 6.0 11/18/2021 1436   GLUCOSEU Negative 12/04/2021 1431   HGBUR NEGATIVE 11/18/2021 1436   BILIRUBINUR Negative 12/04/2021 1431   KETONESUR NEGATIVE 11/18/2021 1436   PROTEINUR Negative 12/04/2021 1431   PROTEINUR NEGATIVE 11/18/2021 1436   NITRITE Negative 12/04/2021 1431   NITRITE NEGATIVE 11/18/2021 1436   LEUKOCYTESUR Negative 12/04/2021 1431   LEUKOCYTESUR NEGATIVE 11/18/2021 1436    Lab Results  Component Value Date   LABMICR See below: 05/24/2020   WBCUA 6-10 (A) 05/24/2020   LABEPIT None seen 05/24/2020   MUCUS Present 05/22/2020   BACTERIA Many (A) 05/24/2020    Pertinent Imaging:  No results found for this or any previous visit.  No results found for this or any previous visit.  No results found for this or any previous visit.  No results found for this or any previous visit.  No results found for this or any previous visit.  No results found for this or any previous visit.  No results found for this or any previous visit.  No results found for this or any previous visit.   Assessment & Plan:    1. Urinary retention -RTC 1 month for PVR. Voiding trial passed today - Bladder Voiding Trial  2. Groin fluid collection -resolved   Return in about 4 weeks (around 01/15/2022) for PVR.  James Bang, MD  [facility] [place]

## 2021-12-18 NOTE — Progress Notes (Signed)
Fill and Pull Catheter Removal  Patient is present today for a catheter removal.  Patient was cleaned and prepped in a sterile fashion 180m of sterile water/ saline was instilled into the bladder when the patient felt the urge to urinate. 118mof water was then drained from the balloon.  A 16FR foley cath was removed from the bladder no complications were noted .  Patient as then given some time to void on their own.  Patient can void  [id number] their own after some time.  Patient tolerated well.  Performed by: Anuoluwapo Mefferd LPN   Follow up/ Additional notes: per MD note

## 2021-12-18 NOTE — Patient Instructions (Signed)
Acute Urinary Retention, Male  Acute urinary retention is when a person cannot pee (urinate) at all, or can only pee a little. This can come on all of a sudden. If it is not treated, it can lead to kidney problems or other serious problems. What are the causes? A problem with the tube that drains the bladder (urethra). Problems with the nerves in the bladder. Tumors. Certain medicines. An infection. Having trouble pooping (constipation). What increases the risk? Older men are more at risk because their prostate gland may become larger as they age. Other conditions also can increase risk. These include: Diseases, such as multiple sclerosis. Injury to the spinal cord. Diabetes. A condition that affects the way the brain works, such as dementia. Holding back urine due to trauma or because you do not want to use the bathroom. What are the signs or symptoms? Trouble peeing. Pain in the lower belly. How is this treated? Treatment for this condition may include: Medicines. Placing a thin, germ-free tube (catheter) into the bladder to drain pee out of the body. Therapy to treat mental health conditions. Treatment for conditions that may cause this. If needed, you may be treated in the hospital for kidney problems or to manage other problems. Follow these instructions at home: Medicines Take over-the-counter and prescription medicines only as told by your doctor. Ask your doctor what medicines you should stay away from. If you were given an antibiotic medicine, take it as told by your doctor. Do not stop taking it, even if you start to feel better. General instructions Do not smoke or use any products that contain nicotine or tobacco. If you need help quitting, ask your doctor. Drink enough fluid to keep your pee pale yellow. If you were sent home with a tube that drains the bladder, take care of it as told by your doctor. Watch for changes in your symptoms. Tell your doctor about them. If  told, keep track of changes in your blood pressure at home. Tell your doctor about them. Keep all follow-up visits. Contact a doctor if: You have spasms in your bladder that you cannot stop. You leak pee when you have spasms. Get help right away if: You have chills or a fever. You have blood in your pee. You have a tube that drains pee from the bladder and these things happen: The tube stops draining pee. The tube falls out. Summary Acute urinary retention is when you cannot pee at all or you pee too little. If this condition is not treated, it can lead to kidney problems or other serious problems. If you were sent home with a tube (catheter) that drains the bladder, take care of it as told by your doctor. Watch for changes in your symptoms. Tell your doctor about them. This information is not intended to replace advice given to you by your health care provider. Make sure you discuss any questions you have with your health care provider. Document Revised: 12/28/2019 Document Reviewed: 12/28/2019 Elsevier Patient Education  2023 [employer]  

## 2021-12-19 DIAGNOSIS — E785 Hyperlipidemia, unspecified: Secondary | ICD-10-CM | POA: Diagnosis not present

## 2021-12-19 DIAGNOSIS — I4891 Unspecified atrial fibrillation: Secondary | ICD-10-CM | POA: Diagnosis not present

## 2021-12-19 DIAGNOSIS — I10 Essential (primary) hypertension: Secondary | ICD-10-CM | POA: Diagnosis not present

## 2022-01-13 ENCOUNTER — Ambulatory Visit (INDEPENDENT_AMBULATORY_CARE_PROVIDER_SITE_OTHER): Payer: Medicare Other | Admitting: Urology

## 2022-01-13 ENCOUNTER — Encounter: Payer: Self-pay | Admitting: Urology

## 2022-01-13 VITALS — BP 103/59 | HR 87

## 2022-01-13 DIAGNOSIS — R339 Retention of urine, unspecified: Secondary | ICD-10-CM

## 2022-01-13 DIAGNOSIS — N401 Enlarged prostate with lower urinary tract symptoms: Secondary | ICD-10-CM | POA: Diagnosis not present

## 2022-01-13 DIAGNOSIS — N138 Other obstructive and reflux uropathy: Secondary | ICD-10-CM | POA: Diagnosis not present

## 2022-01-13 DIAGNOSIS — R188 Other ascites: Secondary | ICD-10-CM

## 2022-01-13 LAB — BLADDER SCAN AMB NON-IMAGING: Scan Result: 51

## 2022-01-13 NOTE — Progress Notes (Unsigned)
post void residual=51

## 2022-01-13 NOTE — Progress Notes (Unsigned)
01/13/2022 3:03 PM   [name] Nov 03, 1934 [id number]  Referring provider: Celene Squibb, MD [address],  [place] [address]  Followup BPh and penile edema   HPI: [name] is a 87yo here for followup for BPh and penile edema. Penile edema resolved. IPSS 0 QOL 0.    PMH: Past Medical History:  Diagnosis Date   CAD (coronary artery disease)    Status post CABG 1999   Essential hypertension    Hyperlipidemia    Rectal cancer [facility])     Surgical History: Past Surgical History:  Procedure Laterality Date   ABDOMINAL AORTOGRAM W/LOWER EXTREMITY N/A 11/07/2021   Procedure: ABDOMINAL AORTOGRAM W/LOWER EXTREMITY;  Surgeon: [name], MD;  Location: [place] [facility];  Service: Cardiovascular;  Laterality: N/A;   CATARACT EXTRACTION W/PHACO Left 06/25/2015   Procedure: CATARACT EXTRACTION PHACO AND INTRAOCULAR LENS PLACEMENT; CDE:  4.69;  Surgeon: Williams Che, MD;  [facility]: [facility];  Service: Ophthalmology;  Laterality: Left;   COLOSTOMY     CORONARY ARTERY BYPASS GRAFT  1999   CYSTOSCOPY WITH INSERTION OF UROLIFT N/A 08/09/2020   Procedure: CYSTOSCOPY WITH INSERTION OF UROLIFT;  Surgeon: Cleon Gustin, MD;  Location: [facility];  Service: Urology;  Laterality: N/A;   ENDARTERECTOMY FEMORAL Left 11/20/2021   Procedure: LEFT COMMON FEMORAL ENDARTERECTOMY WITH PATCH ANGIOPLASTY USING 1 CM X 6 CM XENOSURE BIOLOGIC PATCH;  Surgeon: [name], MD;  Location: [place];  Service: Vascular;  Laterality: Left;   FEMORAL-TIBIAL BYPASS GRAFT Left 11/20/2021   Procedure: HARVEST LEFT LEG GREATER SAPHENOUS VEIN, VEIN PATCH OF POSTERIOR TIBIAL ANGIOPLASTY. LEFT FEMORAL-POSTERIOR TIBIAL BYPASS GRAFT USING 6 MMX 80 CM PROPATEN GORE GRAFT WITH RING. ;  Surgeon: [name], MD;  Location: [place];  Service: Vascular;  Laterality: Left;  INSERT ARTERIAL LINE   RECTAL SURGERY      Home Medications:  Allergies as of 01/13/2022   No Known Allergies       Medication List        Accurate as of January 13, 2022  3:03 PM. If you have any questions, ask your nurse or doctor.          amLODipine 5 MG tablet Commonly known as: NORVASC Take 5 mg by mouth daily.   aspirin EC 81 MG tablet Take 81 mg by mouth daily. Swallow whole.   atorvastatin 10 MG tablet Commonly known as: LIPITOR Take 10 mg by mouth daily.   fish oil-omega-3 fatty acids 1000 MG capsule Take 1-2 g by mouth See admin instructions.  Take 2 g in the morning and 1 g at bedtime   folic acid 474 MCG tablet Commonly known as: FOLVITE Take 400 mcg by mouth daily.   losartan 50 MG tablet Commonly known as: COZAAR Take 50 mg by mouth daily.   multivitamin tablet Take 1 tablet by mouth 3 (three) times a week.   Opcon-A 0.027-0.315 % Soln Generic drug: Naphazoline-Pheniramine Place 1 drop into both eyes daily as needed (Burning eyes).   tamsulosin 0.4 MG Caps capsule Commonly known as: FLOMAX Take 1 capsule (0.4 mg total) by mouth daily after supper.        Allergies: No Known Allergies  Family History: Family History  Problem Relation Age of Onset   Alcoholism Father    Diabetes Mellitus II Father     Social History:  reports that he has never smoked. His smokeless tobacco use includes chew. He  reports that he does not drink alcohol and does not use drugs.  ROS: All other review of systems were reviewed and are negative except what is noted above in HPI  Physical Exam: BP (!) 103/59   Pulse 87   Constitutional:  Alert and oriented, No acute distress. HEENT: [name] AT, moist mucus membranes.  Trachea midline, no masses. Cardiovascular: No clubbing, cyanosis, or edema. Respiratory: Normal respiratory effort, no increased work of breathing. GI: Abdomen is soft, nontender, nondistended, no abdominal masses GU: No CVA tenderness.  Lymph: No cervical or inguinal lymphadenopathy. Skin: No rashes, bruises or suspicious lesions. Neurologic: Grossly  intact, no focal deficits, moving all 4 extremities. Psychiatric: Normal mood and affect.  Laboratory Data: Lab Results  Component Value Date   WBC 12.4 (H) 11/22/2021   HGB 9.5 (L) 11/22/2021   HCT 28.0 (L) 11/22/2021   MCV 89.2 11/22/2021   PLT 110 (L) 11/22/2021    Lab Results  Component Value Date   CREATININE 0.83 11/21/2021    No results found for: "PSA"  No results found for: "TESTOSTERONE"  No results found for: "HGBA1C"  Urinalysis    Component Value Date/Time   COLORURINE YELLOW 11/18/2021 1436   APPEARANCEUR Hazy (A) 12/04/2021 1431   LABSPEC 1.013 11/18/2021 1436   PHURINE 6.0 11/18/2021 1436   GLUCOSEU Negative 12/04/2021 1431   HGBUR NEGATIVE 11/18/2021 1436   BILIRUBINUR Negative 12/04/2021 1431   KETONESUR NEGATIVE 11/18/2021 1436   PROTEINUR Negative 12/04/2021 1431   PROTEINUR NEGATIVE 11/18/2021 1436   NITRITE Negative 12/04/2021 1431   NITRITE NEGATIVE 11/18/2021 1436   LEUKOCYTESUR Negative 12/04/2021 1431   LEUKOCYTESUR NEGATIVE 11/18/2021 1436    Lab Results  Component Value Date   LABMICR See below: 05/24/2020   WBCUA 6-10 (A) 05/24/2020   LABEPIT None seen 05/24/2020   MUCUS Present 05/22/2020   BACTERIA Many (A) 05/24/2020    Pertinent Imaging: *** No results found for this or any previous visit.  No results found for this or any previous visit.  No results found for this or any previous visit.  No results found for this or any previous visit.  No results found for this or any previous visit.  No valid procedures specified. No results found for this or any previous visit.  No results found for this or any previous visit.   Assessment & Plan:    1. Urinary retention *** - BLADDER SCAN AMB NON-IMAGING - Urinalysis, Routine w reflex microscopic  2. Benign prostatic hyperplasia with urinary obstruction *** - BLADDER SCAN AMB NON-IMAGING - Urinalysis, Routine w reflex microscopic   No follow-ups on  file.  Nicolette Bang, MD  [facility] [place]

## 2022-01-14 LAB — URINALYSIS, ROUTINE W REFLEX MICROSCOPIC
Bilirubin, UA: NEGATIVE
Glucose, UA: NEGATIVE
Ketones, UA: NEGATIVE
Nitrite, UA: POSITIVE — AB
Protein,UA: NEGATIVE
Specific Gravity, UA: 1.015 (ref 1.005–1.030)
Urobilinogen, Ur: 2 mg/dL — ABNORMAL HIGH (ref 0.2–1.0)
pH, UA: 6 (ref 5.0–7.5)

## 2022-01-14 LAB — MICROSCOPIC EXAMINATION: WBC, UA: >30 /hpf — AB (ref 0–5)

## 2022-01-16 ENCOUNTER — Encounter: Payer: Self-pay | Admitting: Urology

## 2022-01-16 NOTE — Patient Instructions (Signed)

## 2022-01-29 DIAGNOSIS — Z23 Encounter for immunization: Secondary | ICD-10-CM | POA: Diagnosis not present

## 2022-02-27 DIAGNOSIS — E785 Hyperlipidemia, unspecified: Secondary | ICD-10-CM | POA: Diagnosis not present

## 2022-02-27 DIAGNOSIS — I10 Essential (primary) hypertension: Secondary | ICD-10-CM | POA: Diagnosis not present

## 2022-03-04 NOTE — Progress Notes (Unsigned)
Office Note     CC:  follow up Requesting Provider:  Celene Squibb, MD  HPI: [name] is a 87 y.o. (06/14/1934) male who presents for routine follow up of PAD. He is s/p left common femoral endarterectomy with bovine patch angioplasty, harvest of saphenous vein for vein patch of the left PTA and common femoral to posterior tibial artery bypass with PTFE by Dr. Carlis Abbott on 11/20/2021 due to wound between the left fourth and fifth toes. At time of his last visit his wounds were essentially healed. He had a palpable PT pulses on examination suggesting patency of his bypass graft.  Today he returns with non invasive studies. He has known right SFA occlusion based on prior Angiogram. He denies any new wounds. He denies any pain on ambulation or rest. He does report that around 4-5 pm he will have some soreness in his groin areas bilaterally especially if he has done a lot of walking. This goes away with rest. No pain at night. Stays active and says he tries to do everything he can for himself. Uses cane to assist with walking  The pt is on a statin for cholesterol management.  The pt is on a daily aspirin.   Other AC:  none The pt is on CCB, ARB for hypertension.   The pt is not diabetic.  Tobacco hx:  currently uses chew daily  Past Medical History:  Diagnosis Date   CAD (coronary artery disease)    Status post CABG 1999   Essential hypertension    Hyperlipidemia    Rectal cancer [facility])     Past Surgical History:  Procedure Laterality Date   ABDOMINAL AORTOGRAM W/LOWER EXTREMITY N/A 11/07/2021   Procedure: ABDOMINAL AORTOGRAM W/LOWER EXTREMITY;  Surgeon: [name], MD;  Location: [place] [facility];  Service: Cardiovascular;  Laterality: N/A;   CATARACT EXTRACTION W/PHACO Left 06/25/2015   Procedure: CATARACT EXTRACTION PHACO AND INTRAOCULAR LENS PLACEMENT; CDE:  4.69;  Surgeon: Williams Che, MD;  [facility];  Service: Ophthalmology;  Laterality: Left;   COLOSTOMY      CORONARY ARTERY BYPASS GRAFT  1999   CYSTOSCOPY WITH INSERTION OF UROLIFT N/A 08/09/2020   Procedure: CYSTOSCOPY WITH INSERTION OF UROLIFT;  Surgeon: Cleon Gustin, MD;  [facility];  Service: Urology;  Laterality: N/A;   ENDARTERECTOMY FEMORAL Left 11/20/2021   Procedure: LEFT COMMON FEMORAL ENDARTERECTOMY WITH PATCH ANGIOPLASTY USING 1 CM X 6 CM XENOSURE BIOLOGIC PATCH;  Surgeon: [name], MD;  Location: [place];  Service: Vascular;  Laterality: Left;   FEMORAL-TIBIAL BYPASS GRAFT Left 11/20/2021   Procedure: HARVEST LEFT LEG GREATER SAPHENOUS VEIN, VEIN PATCH OF POSTERIOR TIBIAL ANGIOPLASTY. LEFT FEMORAL-POSTERIOR TIBIAL BYPASS GRAFT USING 6 MMX 80 CM PROPATEN GORE GRAFT WITH RING. ;  Surgeon: [name], MD;  [facility];  Service: Vascular;  Laterality: Left;  INSERT ARTERIAL LINE   RECTAL SURGERY      Social History   Socioeconomic History   Marital status: Married    Spouse name: Romie Minus   Number of children: Not on file   Years of education: Not on file   Highest education level: Not on file  Occupational History   Occupation: Retired  Tobacco Use   Smoking status: Never    Passive exposure: Never   Smokeless tobacco: Current    Types: Chew  Vaping Use   Vaping Use: Never used  Substance and Sexual Activity   Alcohol use: No  Drug use: No   Sexual activity: Not Currently  Other Topics Concern   Not on file  Social History Narrative   Exercises regularly   Social Determinants of Health   Financial Resource Strain: Not on file  Food Insecurity: Not on file  Transportation Needs: Not on file  Physical Activity: Not on file  Stress: Not on file  Social Connections: Not on file  Intimate Partner Violence: Not on file    Family History  Problem Relation Age of Onset   Alcoholism Father    Diabetes Mellitus II Father     Current Outpatient Medications  Medication Sig Dispense Refill   amLODipine (NORVASC) 5 MG tablet Take 5 mg by  mouth daily.     aspirin EC 81 MG tablet Take 81 mg by mouth daily. Swallow whole.     atorvastatin (LIPITOR) 10 MG tablet Take 10 mg by mouth daily.     fish oil-omega-3 fatty acids 1000 MG capsule Take 1-2 g by mouth See admin instructions.  Take 2 g in the morning and 1 g at bedtime     folic acid (FOLVITE) 053 MCG tablet Take 400 mcg by mouth daily.     losartan (COZAAR) 50 MG tablet Take 50 mg by mouth daily.     Multiple Vitamin (MULTIVITAMIN) tablet Take 1 tablet by mouth 3 (three) times a week.     Naphazoline-Pheniramine (OPCON-A) 0.027-0.315 % SOLN Place 1 drop into both eyes daily as needed (Burning eyes).     tamsulosin (FLOMAX) 0.4 MG CAPS capsule Take 1 capsule (0.4 mg total) by mouth daily after supper. 30 capsule 0   No current facility-administered medications for this visit.    No Known Allergies   REVIEW OF SYSTEMS:  '[X]'$  denotes positive finding, '[ ]'$  denotes negative finding Cardiac  Comments:  Chest pain or chest pressure:    Shortness of breath upon exertion:    Short of breath when lying flat:    Irregular heart rhythm:        Vascular    Pain in calf, thigh, or hip brought on by ambulation:    Pain in feet at night that wakes you up from your sleep:     Blood clot in your veins:    Leg swelling:         Pulmonary    Oxygen at home:    Productive cough:     Wheezing:         Neurologic    Sudden weakness in arms or legs:     Sudden numbness in arms or legs:     Sudden onset of difficulty speaking or slurred speech:    Temporary loss of vision in one eye:     Problems with dizziness:         Gastrointestinal    Blood in stool:     Vomited blood:         Genitourinary    Burning when urinating:     Blood in urine:        Psychiatric    Major depression:         Hematologic    Bleeding problems:    Problems with blood clotting too easily:        Skin    Rashes or ulcers:        Constitutional    Fever or chills:      PHYSICAL  EXAMINATION:  Vitals:   03/05/22 1136  BP: (!) 119/57  Pulse: 84  Resp: 20  Temp: (!) 97.1 F (36.2 C)  TempSrc: Temporal  SpO2: 99%  Weight: 121 lb 4.8 oz (55 kg)  Height: '5\' 8"'$  (1.727 m)    General:  WDWN in NAD; vital signs documented above Gait: uses cane HENT: WNL, normocephalic Pulmonary: normal non-labored breathing  Cardiac: regular HR, without  Murmurs without carotid bruits Vascular Exam/Pulses:  Right Left  Radial 2+ (normal) 2+ (normal)  Femoral 2+ (normal) 2+ (normal)  Popliteal Not palpable Not palpable  DP absent absent  PT absent 2+ (normal)   Extremities: without ischemic changes, without Gangrene , without cellulitis; without open wounds; Left groin and left BK incisions are well healed Musculoskeletal: no muscle wasting or atrophy  Neurologic: A&O X 3;  No focal weakness or paresthesias are detected Psychiatric:  The pt has Normal affect.   Non-Invasive Vascular Imaging:   +-------+-----------+-----------+------------+------------+  ABI/TBIToday's ABIToday's TBIPrevious ABIPrevious TBI  +-------+-----------+-----------+------------+------------+  Right 0.62       0.25       0.63        0.53          +-------+-----------+-----------+------------+------------+  Left  1.17       0.66       0.46        0.28          +-------+-----------+-----------+------------+------------+   VAS [place] Lower Extremity Bypass Graft Duplex: Summary:  Left: Widely patent femoral to posterior tibial artery bypass graft without evidence of stenosis.   ASSESSMENT/PLAN:: 87 y.o. male here for follow up for PAD. He is s/p left common femoral endarterectomy with bovine patch angioplasty, harvest of saphenous vein for vein patch of the left PTA and common femoral to posterior tibial artery bypass with PTFE by Dr. Carlis Abbott on 11/20/2021 due to wound between the left fourth and fifth toes. His wounds have since healed. He is not having any claudication, rest pain or tissue  loss. BLE remain well perfused and warm - Duplex today shows patent left lower extremity CF to PT bypass - ABI's show significant increase on the LLE post surgery, Right remains stable - Continue Aspirin and `statin - He knows to call for earlier follow up if he develops any claudication, rest pain or new wounds - He will follow up again in 6 months with ABI and LLE bypass duplex   Karoline Caldwell, PA-C [facility] [phone]  Clinic MD:   [place]

## 2022-03-05 ENCOUNTER — Ambulatory Visit (INDEPENDENT_AMBULATORY_CARE_PROVIDER_SITE_OTHER)
Admission: RE | Admit: 2022-03-05 | Discharge: 2022-03-05 | Disposition: A | Discharge status: Home / Self Care | Payer: Medicare Other | Source: Ambulatory Visit | Attending: Vascular Surgery | Admitting: Vascular Surgery

## 2022-03-05 ENCOUNTER — Ambulatory Visit (INDEPENDENT_AMBULATORY_CARE_PROVIDER_SITE_OTHER): Payer: Medicare Other | Admitting: Physician Assistant

## 2022-03-05 ENCOUNTER — Ambulatory Visit (HOSPITAL_COMMUNITY)
Admission: RE | Admit: 2022-03-05 | Discharge: 2022-03-05 | Disposition: A | Discharge status: Home / Self Care | Payer: Medicare Other | Source: Ambulatory Visit | Attending: Vascular Surgery | Admitting: Vascular Surgery

## 2022-03-05 VITALS — BP 119/57 | HR 84 | Temp 97.1°F | Resp 20 | Ht 68.0 in | Wt 121.3 lb

## 2022-03-05 DIAGNOSIS — I70222 Atherosclerosis of native arteries of extremities with rest pain, left leg: Secondary | ICD-10-CM

## 2022-03-05 DIAGNOSIS — I739 Peripheral vascular disease, unspecified: Secondary | ICD-10-CM

## 2022-03-06 DIAGNOSIS — L600 Ingrowing nail: Secondary | ICD-10-CM | POA: Diagnosis not present

## 2022-03-06 DIAGNOSIS — N400 Enlarged prostate without lower urinary tract symptoms: Secondary | ICD-10-CM | POA: Diagnosis not present

## 2022-03-06 DIAGNOSIS — R42 Dizziness and giddiness: Secondary | ICD-10-CM | POA: Diagnosis not present

## 2022-03-06 DIAGNOSIS — D649 Anemia, unspecified: Secondary | ICD-10-CM | POA: Diagnosis not present

## 2022-03-06 DIAGNOSIS — E785 Hyperlipidemia, unspecified: Secondary | ICD-10-CM | POA: Diagnosis not present

## 2022-03-06 DIAGNOSIS — I10 Essential (primary) hypertension: Secondary | ICD-10-CM | POA: Diagnosis not present

## 2022-03-06 DIAGNOSIS — I4891 Unspecified atrial fibrillation: Secondary | ICD-10-CM | POA: Diagnosis not present

## 2022-03-07 ENCOUNTER — Other Ambulatory Visit: Payer: Self-pay

## 2022-03-07 DIAGNOSIS — I70222 Atherosclerosis of native arteries of extremities with rest pain, left leg: Secondary | ICD-10-CM

## 2022-03-07 DIAGNOSIS — I739 Peripheral vascular disease, unspecified: Secondary | ICD-10-CM

## 2022-07-16 ENCOUNTER — Ambulatory Visit (INDEPENDENT_AMBULATORY_CARE_PROVIDER_SITE_OTHER): Payer: Medicare Other | Admitting: Urology

## 2022-07-16 ENCOUNTER — Encounter: Payer: Self-pay | Admitting: Urology

## 2022-07-16 VITALS — BP 106/58 | HR 77

## 2022-07-16 DIAGNOSIS — R300 Dysuria: Secondary | ICD-10-CM

## 2022-07-16 DIAGNOSIS — N3001 Acute cystitis with hematuria: Secondary | ICD-10-CM

## 2022-07-16 DIAGNOSIS — N401 Enlarged prostate with lower urinary tract symptoms: Secondary | ICD-10-CM | POA: Diagnosis not present

## 2022-07-16 DIAGNOSIS — N138 Other obstructive and reflux uropathy: Secondary | ICD-10-CM

## 2022-07-16 DIAGNOSIS — R339 Retention of urine, unspecified: Secondary | ICD-10-CM | POA: Diagnosis not present

## 2022-07-16 DIAGNOSIS — R350 Frequency of micturition: Secondary | ICD-10-CM

## 2022-07-16 LAB — URINALYSIS, ROUTINE W REFLEX MICROSCOPIC
Bilirubin, UA: NEGATIVE
Glucose, UA: NEGATIVE
Nitrite, UA: POSITIVE — AB
Protein,UA: NEGATIVE
Specific Gravity, UA: 1.015 (ref 1.005–1.030)
Urobilinogen, Ur: 0.2 mg/dL (ref 0.2–1.0)
pH, UA: 6 (ref 5.0–7.5)

## 2022-07-16 LAB — MICROSCOPIC EXAMINATION: WBC, UA: >30 /hpf — AB (ref 0–5)

## 2022-07-16 LAB — BLADDER SCAN AMB NON-IMAGING: Scan Result: 146

## 2022-07-16 MED ORDER — NITROFURANTOIN MONOHYD MACRO 100 MG PO CAPS: 100.0000 mg | ORAL_CAPSULE | Freq: Two times a day (BID) | ORAL | 0 refills | Status: DC

## 2022-07-16 NOTE — Progress Notes (Unsigned)
post void residual=146 

## 2022-07-16 NOTE — Progress Notes (Unsigned)
07/16/2022 2:17 PM   [name] 04/28/1934 [id number]  Referring provider: Celene Squibb, MD [address] [name],  [place] [address]  No chief complaint on file.   HPI:    PMH: Past Medical History:  Diagnosis Date   CAD (coronary artery disease)    Status post CABG 1999   Essential hypertension    Hyperlipidemia    Rectal cancer [facility])     Surgical History: Past Surgical History:  Procedure Laterality Date   ABDOMINAL AORTOGRAM W/LOWER EXTREMITY N/A 11/07/2021   Procedure: ABDOMINAL AORTOGRAM W/LOWER EXTREMITY;  Surgeon: [name], MD;  Location: [place] [facility];  Service: Cardiovascular;  Laterality: N/A;   CATARACT EXTRACTION W/PHACO Left 06/25/2015   Procedure: CATARACT EXTRACTION PHACO AND INTRAOCULAR LENS PLACEMENT; CDE:  4.69;  Surgeon: Williams Che, MD;  [facility];  Service: Ophthalmology;  Laterality: Left;   COLOSTOMY     CORONARY ARTERY BYPASS GRAFT  1999   CYSTOSCOPY WITH INSERTION OF UROLIFT N/A 08/09/2020   Procedure: CYSTOSCOPY WITH INSERTION OF UROLIFT;  Surgeon: Cleon Gustin, MD;  [facility];  Service: Urology;  Laterality: N/A;   ENDARTERECTOMY FEMORAL Left 11/20/2021   Procedure: LEFT COMMON FEMORAL ENDARTERECTOMY WITH PATCH ANGIOPLASTY USING 1 CM X 6 CM XENOSURE BIOLOGIC PATCH;  Surgeon: [name], MD;  Location: [place];  Service: Vascular;  Laterality: Left;   FEMORAL-TIBIAL BYPASS GRAFT Left 11/20/2021   Procedure: HARVEST LEFT LEG GREATER SAPHENOUS VEIN, VEIN PATCH OF POSTERIOR TIBIAL ANGIOPLASTY. LEFT FEMORAL-POSTERIOR TIBIAL BYPASS GRAFT USING 6 MMX 80 CM PROPATEN GORE GRAFT WITH RING. ;  Surgeon: [name], MD;  Location: [place];  Service: Vascular;  Laterality: Left;  INSERT ARTERIAL LINE   RECTAL SURGERY      Home Medications:  Allergies as of 07/16/2022   No Known Allergies      Medication List        Accurate as of July 16, 2022  2:17 PM. If you have any questions, ask  your nurse or doctor.          amLODipine 5 MG tablet Commonly known as: NORVASC Take 5 mg by mouth daily.   aspirin EC 81 MG tablet Take 81 mg by mouth daily. Swallow whole.   atorvastatin 10 MG tablet Commonly known as: LIPITOR Take 10 mg by mouth daily.   fish oil-omega-3 fatty acids 1000 MG capsule Take 1-2 g by mouth See admin instructions.  Take 2 g in the morning and 1 g at bedtime   folic acid A999333 MCG tablet Commonly known as: FOLVITE Take 400 mcg by mouth daily.   losartan 50 MG tablet Commonly known as: COZAAR Take 50 mg by mouth daily.   multivitamin tablet Take 1 tablet by mouth 3 (three) times a week.   Opcon-A 0.027-0.315 % Soln Generic drug: Naphazoline-Pheniramine Place 1 drop into both eyes daily as needed (Burning eyes).   tamsulosin 0.4 MG Caps capsule Commonly known as: FLOMAX Take 1 capsule (0.4 mg total) by mouth daily after supper.        Allergies: No Known Allergies  Family History: Family History  Problem Relation Age of Onset   Alcoholism Father    Diabetes Mellitus II Father     Social History:  reports that he has never smoked. He has never been exposed to tobacco smoke. His smokeless tobacco use includes chew. He reports that he does not drink alcohol and does not use drugs.  ROS: All other review of systems were reviewed and are negative except what is noted above in HPI  Physical Exam: BP (!) 106/58   Pulse 77   Constitutional:  Alert and oriented, No acute distress. HEENT: [place] AT, moist mucus membranes.  Trachea midline, no masses. Cardiovascular: No clubbing, cyanosis, or edema. Respiratory: Normal respiratory effort, no increased work of breathing. GI: Abdomen is soft, nontender, nondistended, no abdominal masses GU: No CVA tenderness.  Lymph: No cervical or inguinal lymphadenopathy. Skin: No rashes, bruises or suspicious lesions. Neurologic: Grossly intact, no focal deficits, moving all 4 extremities. Psychiatric:  Normal mood and affect.  Laboratory Data: Lab Results  Component Value Date   WBC 12.4 (H) 11/22/2021   HGB 9.5 (L) 11/22/2021   HCT 28.0 (L) 11/22/2021   MCV 89.2 11/22/2021   PLT 110 (L) 11/22/2021    Lab Results  Component Value Date   CREATININE 0.83 11/21/2021    No results found for: "PSA"  No results found for: "TESTOSTERONE"  No results found for: "HGBA1C"  Urinalysis    Component Value Date/Time   COLORURINE YELLOW 11/18/2021 1436   APPEARANCEUR Cloudy (A) 01/13/2022 1519   LABSPEC 1.013 11/18/2021 1436   PHURINE 6.0 11/18/2021 1436   GLUCOSEU Negative 01/13/2022 1519   HGBUR NEGATIVE 11/18/2021 1436   BILIRUBINUR Negative 01/13/2022 1519   KETONESUR NEGATIVE 11/18/2021 1436   PROTEINUR Negative 01/13/2022 1519   PROTEINUR NEGATIVE 11/18/2021 1436   NITRITE Positive (A) 01/13/2022 1519   NITRITE NEGATIVE 11/18/2021 1436   LEUKOCYTESUR 3+ (A) 01/13/2022 1519   LEUKOCYTESUR NEGATIVE 11/18/2021 1436    Lab Results  Component Value Date   LABMICR See below: 01/13/2022   WBCUA >30 (A) 01/13/2022   LABEPIT 0-10 01/13/2022   MUCUS Present 05/22/2020   BACTERIA Many (A) 01/13/2022    Pertinent Imaging: *** No results found for this or any previous visit.  No results found for this or any previous visit.  No results found for this or any previous visit.  No results found for this or any previous visit.  No results found for this or any previous visit.  No valid procedures specified. No results found for this or any previous visit.  No results found for this or any previous visit.   Assessment & Plan:    1. Benign prostatic hyperplasia with urinary obstruction Improved after urolift - Urinalysis, Routine w reflex microscopic  2. Urinary retention --improved after Urolift - Urinalysis, Routine w reflex microscopic - BLADDER SCAN AMB NON-IMAGING  3. Acute cystitis -urine for culture -macrobid 100mg  BID for 7 days   No follow-ups on  file.  Nicolette Bang, MD  [facility] [place]

## 2022-07-16 NOTE — Patient Instructions (Signed)

## 2022-07-18 LAB — URINE CULTURE

## 2022-08-29 DIAGNOSIS — I10 Essential (primary) hypertension: Secondary | ICD-10-CM | POA: Diagnosis not present

## 2022-09-03 DIAGNOSIS — Z0000 Encounter for general adult medical examination without abnormal findings: Secondary | ICD-10-CM | POA: Diagnosis not present

## 2022-09-04 DIAGNOSIS — I10 Essential (primary) hypertension: Secondary | ICD-10-CM | POA: Diagnosis not present

## 2022-09-04 DIAGNOSIS — N400 Enlarged prostate without lower urinary tract symptoms: Secondary | ICD-10-CM | POA: Diagnosis not present

## 2022-09-04 DIAGNOSIS — R42 Dizziness and giddiness: Secondary | ICD-10-CM | POA: Diagnosis not present

## 2022-09-04 DIAGNOSIS — L600 Ingrowing nail: Secondary | ICD-10-CM | POA: Diagnosis not present

## 2022-09-04 DIAGNOSIS — E785 Hyperlipidemia, unspecified: Secondary | ICD-10-CM | POA: Diagnosis not present

## 2022-09-04 DIAGNOSIS — D649 Anemia, unspecified: Secondary | ICD-10-CM | POA: Diagnosis not present

## 2022-09-04 DIAGNOSIS — I4891 Unspecified atrial fibrillation: Secondary | ICD-10-CM | POA: Diagnosis not present

## 2022-10-03 ENCOUNTER — Other Ambulatory Visit: Payer: Self-pay | Admitting: *Deleted

## 2022-10-03 DIAGNOSIS — I739 Peripheral vascular disease, unspecified: Secondary | ICD-10-CM

## 2022-10-03 DIAGNOSIS — I70222 Atherosclerosis of native arteries of extremities with rest pain, left leg: Secondary | ICD-10-CM

## 2022-10-03 DIAGNOSIS — L97529 Non-pressure chronic ulcer of other part of left foot with unspecified severity: Secondary | ICD-10-CM

## 2022-10-14 ENCOUNTER — Ambulatory Visit (INDEPENDENT_AMBULATORY_CARE_PROVIDER_SITE_OTHER)
Admission: RE | Admit: 2022-10-14 | Discharge: 2022-10-14 | Disposition: A | Discharge status: Home / Self Care | Payer: Medicare Other | Source: Ambulatory Visit | Attending: Vascular Surgery | Admitting: Vascular Surgery

## 2022-10-14 ENCOUNTER — Ambulatory Visit (INDEPENDENT_AMBULATORY_CARE_PROVIDER_SITE_OTHER): Payer: Medicare Other | Admitting: Physician Assistant

## 2022-10-14 ENCOUNTER — Ambulatory Visit (HOSPITAL_COMMUNITY)
Admission: RE | Admit: 2022-10-14 | Discharge: 2022-10-14 | Disposition: A | Discharge status: Home / Self Care | Payer: Medicare Other | Source: Ambulatory Visit | Attending: Vascular Surgery | Admitting: Vascular Surgery

## 2022-10-14 VITALS — BP 122/57 | HR 76 | Temp 97.7°F | Resp 18 | Ht 68.0 in | Wt 118.4 lb

## 2022-10-14 DIAGNOSIS — I739 Peripheral vascular disease, unspecified: Secondary | ICD-10-CM | POA: Insufficient documentation

## 2022-10-14 DIAGNOSIS — I70222 Atherosclerosis of native arteries of extremities with rest pain, left leg: Secondary | ICD-10-CM

## 2022-10-14 DIAGNOSIS — L97529 Non-pressure chronic ulcer of other part of left foot with unspecified severity: Secondary | ICD-10-CM | POA: Diagnosis not present

## 2022-10-14 LAB — VAS US ABI WITH/WO TBI
Left ABI: 1.03
Right ABI: 0.57

## 2022-10-14 NOTE — Progress Notes (Signed)
HISTORY AND PHYSICAL     CC:  follow up. Requesting Provider:  Benita Stabile, MD  HPI: This is a 88 y.o. male who is here today for follow up for PAD.  Pt has hx of  left common femoral endarterectomy with bovine patch angioplasty, harvest of saphenous vein for vein patch of the left PTA and common femoral to posterior tibial artery bypass with PTFE by Dr. Chestine Spore on 11/20/2021 due to wound between the left fourth and fifth toes.    He has known right SFA occlusion based on prior Angiogram   Pt was last seen 03/05/2022 and at that time, he was not having any claudication, new wounds or rest pain.  He was getting some soreness in his groins late in the date especially after a lot of walking.  He was staying active. He was using a cane to assist with walking.   The pt returns today for follow up and here with his stepson.  Pt states he is doing well.  He denies any claudication, rest pain or non healing wounds.  He states that the 4th and 5th toes on the right foot are missing after he shot his foot hunting when he was 88 y/o.   The pt is on a statin for cholesterol management.    The pt is on an aspirin.    Other AC:  none The pt is on CCB, ARB for hypertension.  The pt is not on medication for diabetes. Tobacco hx:  never  Pt does not have family hx of AAA.  Past Medical History:  Diagnosis Date   CAD (coronary artery disease)    Status post CABG 1999   Essential hypertension    Hyperlipidemia    Rectal cancer [facility])     Past Surgical History:  Procedure Laterality Date   ABDOMINAL AORTOGRAM W/LOWER EXTREMITY N/A 11/07/2021   Procedure: ABDOMINAL AORTOGRAM W/LOWER EXTREMITY;  Surgeon: Cephus Shelling, MD;  [facility];  Service: Cardiovascular;  Laterality: N/A;   CATARACT EXTRACTION W/PHACO Left 06/25/2015   Procedure: CATARACT EXTRACTION PHACO AND INTRAOCULAR LENS PLACEMENT; CDE:  4.69;  Surgeon: Susa Simmonds, MD;  Location: [facility];  Service: Ophthalmology;   Laterality: Left;   COLOSTOMY     CORONARY ARTERY BYPASS GRAFT  1999   CYSTOSCOPY WITH INSERTION OF UROLIFT N/A 08/09/2020   Procedure: CYSTOSCOPY WITH INSERTION OF UROLIFT;  Surgeon: Malen Gauze, MD;  Location: [facility];  Service: Urology;  Laterality: N/A;   ENDARTERECTOMY FEMORAL Left 11/20/2021   Procedure: LEFT COMMON FEMORAL ENDARTERECTOMY WITH PATCH ANGIOPLASTY USING 1 CM X 6 CM XENOSURE BIOLOGIC PATCH;  Surgeon: Cephus Shelling, MD;  [facility];  Service: Vascular;  Laterality: Left;   FEMORAL-TIBIAL BYPASS GRAFT Left 11/20/2021   Procedure: HARVEST LEFT LEG GREATER SAPHENOUS VEIN, VEIN PATCH OF POSTERIOR TIBIAL ANGIOPLASTY. LEFT FEMORAL-POSTERIOR TIBIAL BYPASS GRAFT USING 6 MMX 80 CM PROPATEN GORE GRAFT WITH RING. ;  Surgeon: Cephus Shelling, MD;  [facility];  Service: Vascular;  Laterality: Left;  INSERT ARTERIAL LINE   RECTAL SURGERY      No Known Allergies  Current Outpatient Medications  Medication Sig Dispense Refill   amLODipine (NORVASC) 5 MG tablet Take 5 mg by mouth daily.     aspirin EC 81 MG tablet Take 81 mg by mouth daily. Swallow whole.     atorvastatin (LIPITOR) 10 MG tablet Take 10 mg by mouth daily.     fish oil-omega-3  fatty acids 1000 MG capsule Take 1-2 g by mouth See admin instructions.  Take 2 g in the morning and 1 g at bedtime     folic acid (FOLVITE) 400 MCG tablet Take 400 mcg by mouth daily.     losartan (COZAAR) 50 MG tablet Take 50 mg by mouth daily.     Multiple Vitamin (MULTIVITAMIN) tablet Take 1 tablet by mouth 3 (three) times a week.     Naphazoline-Pheniramine (OPCON-A) 0.027-0.315 % SOLN Place 1 drop into both eyes daily as needed (Burning eyes).     nitrofurantoin, macrocrystal-monohydrate, (MACROBID) 100 MG capsule Take 1 capsule (100 mg total) by mouth every 12 (twelve) hours. 14 capsule 0   tamsulosin (FLOMAX) 0.4 MG CAPS capsule Take 1 capsule (0.4 mg total) by mouth daily after supper. 30 capsule 0   No current  facility-administered medications for this visit.    Family History  Problem Relation Age of Onset   Alcoholism Father    Diabetes Mellitus II Father     Social History   Socioeconomic History   Marital status: Married    Spouse name: Carney Bern   Number of children: Not on file   Years of education: Not on file   Highest education level: Not on file  Occupational History   Occupation: Retired  Tobacco Use   Smoking status: Never    Passive exposure: Never   Smokeless tobacco: Current    Types: Chew  Vaping Use   Vaping Use: Never used  Substance and Sexual Activity   Alcohol use: No   Drug use: No   Sexual activity: Not Currently  Other Topics Concern   Not on file  Social History Narrative   Exercises regularly   Social Determinants of Health   Financial Resource Strain: Not on file  Food Insecurity: Not on file  Transportation Needs: Not on file  Physical Activity: Not on file  Stress: Not on file  Social Connections: Not on file  Intimate Partner Violence: Not on file     REVIEW OF SYSTEMS:   [X]  denotes positive finding, [ ]  denotes negative finding Cardiac  Comments:  Chest pain or chest pressure:    Shortness of breath upon exertion:    Short of breath when lying flat:    Irregular heart rhythm:        Vascular    Pain in calf, thigh, or hip brought on by ambulation:    Pain in feet at night that wakes you up from your sleep:     Blood clot in your veins:    Leg swelling:         Pulmonary    Oxygen at home:    Productive cough:     Wheezing:         Neurologic    Sudden weakness in arms or legs:     Sudden numbness in arms or legs:     Sudden onset of difficulty speaking or slurred speech:    Temporary loss of vision in one eye:     Problems with dizziness:         Gastrointestinal    Blood in stool:     Vomited blood:         Genitourinary    Burning when urinating:     Blood in urine:        Psychiatric    Major depression:          Hematologic    Bleeding problems:  Problems with blood clotting too easily:        Skin    Rashes or ulcers:        Constitutional    Fever or chills:      PHYSICAL EXAMINATION:  Today's Vitals   10/14/22 1409  BP: (!) 122/57  Pulse: 76  Resp: 18  Temp: 97.7 F (36.5 C)  TempSrc: Temporal  SpO2: 100%  Weight: 118 lb 6.4 oz (53.7 kg)  Height: 5\' 8"  (1.727 m)  PainSc: 0-No pain   Body mass index is 18 kg/m.   General:  WDWN in NAD; vital signs documented above Gait: Not observed HENT: WNL, normocephalic Pulmonary: normal non-labored breathing , without wheezing Cardiac: regular HR, without carotid bruits Abdomen: soft, NT; aortic pulse is not palpable Skin: without rashes Vascular Exam/Pulses: Palpable radial pulses bilaterally; left DP is palpable; right pedal pulses are not palpable. 4th and 5th right toes absent Extremities: without ischemic changes, without Gangrene , without cellulitis; without open wounds Musculoskeletal: no muscle wasting or atrophy  Neurologic: A&O X 3 Psychiatric:  The pt has Normal affect.   Non-Invasive Vascular Imaging:   ABI's/TBI's on 10/14/2022: Right:  0.57/0.33 - Great toe pressure: 50 Left:  1.03/0.62 - Great toe pressure: 93  Arterial duplex on 10/14/2022: Left Graft #1: femoral to posterior tibial  +--------------------+--------+--------+--------+--------+                     PSV cm/sStenosisWaveformComments  +--------------------+--------+--------+--------+--------+  Inflow             118             biphasic          +--------------------+--------+--------+--------+--------+  Proximal Anastomosis186             biphasic          +--------------------+--------+--------+--------+--------+  Proximal Graft      122             biphasic          +--------------------+--------+--------+--------+--------+  Mid Graft           79              biphasic           +--------------------+--------+--------+--------+--------+  Distal Graft        52              biphasic          +--------------------+--------+--------+--------+--------+  Distal Anastomosis  91              biphasic          +--------------------+--------+--------+--------+--------+  Outflow            110             biphasic          +--------------------+--------+--------+--------+--------+   Summary:  Left: Patent femoral to posterior tibial artery bypass.   Previous ABI's/TBI's on 03/05/2022: Right:  0.62/0.25 - Great toe pressure: 36 Left:  1.17/0.66 - Great toe pressure:  95  Previous arterial duplex on 03/05/2022: Left Graft #1: CFA to posterior tibial artery  +--------------------+--------+--------+----------+--------+                     PSV cm/sStenosisWaveform  Comments  +--------------------+--------+--------+----------+--------+  Inflow             119             biphasic            +--------------------+--------+--------+----------+--------+  Proximal Anastomosis162             monophasic          +--------------------+--------+--------+----------+--------+  Proximal Graft      89              monophasic          +--------------------+--------+--------+----------+--------+  Mid Graft           67              monophasic          +--------------------+--------+--------+----------+--------+  Distal Graft        67              monophasic          +--------------------+--------+--------+----------+--------+  Distal Anastomosis  84              biphasic            +--------------------+--------+--------+----------+--------+  Outflow            149             biphasic            +--------------------+--------+--------+----------+--------+  Summary:  Left: Widely patent femoral to posterior tibial artery bypass graft without evidence of stenosis.     ASSESSMENT/PLAN:: 88 y.o. male here for follow  up for PAD with hx of  left common femoral endarterectomy with bovine patch angioplasty, harvest of saphenous vein for vein patch of the left PTA and common femoral to posterior tibial artery bypass with PTFE by Dr. Chestine Spore on 11/20/2021 due to wound between the left fourth and fifth toes.    -pt doing well with palpable left DP pulse.  He does not have claudication, rest pain or non healing wounds. -encouraged him to continue to protect his feet and he can do as much walking as he wants and can tolerate.  -continue asa/statin -pt will f/u in one year with ABI and LLE arterial duplex.  Discussed with pt and his step son that as long as he is not having any issues with the right leg, he does not need intervention but that would change if he develops any non healing wound or rest pain.     Doreatha Massed, [facility] [phone]  Clinic MD:   Chestine Spore

## 2022-10-22 ENCOUNTER — Other Ambulatory Visit: Payer: Self-pay

## 2022-10-22 DIAGNOSIS — I739 Peripheral vascular disease, unspecified: Secondary | ICD-10-CM

## 2022-10-22 DIAGNOSIS — I70222 Atherosclerosis of native arteries of extremities with rest pain, left leg: Secondary | ICD-10-CM

## 2022-10-22 DIAGNOSIS — L97529 Non-pressure chronic ulcer of other part of left foot with unspecified severity: Secondary | ICD-10-CM

## 2023-01-12 ENCOUNTER — Ambulatory Visit: Payer: Medicare Other | Admitting: Urology

## 2023-01-12 VITALS — BP 101/54 | HR 71

## 2023-01-12 DIAGNOSIS — R350 Frequency of micturition: Secondary | ICD-10-CM | POA: Diagnosis not present

## 2023-01-12 DIAGNOSIS — N138 Other obstructive and reflux uropathy: Secondary | ICD-10-CM | POA: Diagnosis not present

## 2023-01-12 DIAGNOSIS — R339 Retention of urine, unspecified: Secondary | ICD-10-CM

## 2023-01-12 DIAGNOSIS — N3001 Acute cystitis with hematuria: Secondary | ICD-10-CM

## 2023-01-12 DIAGNOSIS — N401 Enlarged prostate with lower urinary tract symptoms: Secondary | ICD-10-CM

## 2023-01-12 DIAGNOSIS — R82998 Other abnormal findings in urine: Secondary | ICD-10-CM | POA: Diagnosis not present

## 2023-01-12 LAB — URINALYSIS, ROUTINE W REFLEX MICROSCOPIC
Bilirubin, UA: NEGATIVE
Glucose, UA: NEGATIVE
Nitrite, UA: NEGATIVE
Specific Gravity, UA: 1.015 (ref 1.005–1.030)
Urobilinogen, Ur: 1 mg/dL (ref 0.2–1.0)
pH, UA: 7.5 (ref 5.0–7.5)

## 2023-01-12 LAB — MICROSCOPIC EXAMINATION: WBC, UA: >30 /hpf — AB (ref 0–5)

## 2023-01-12 MED ORDER — DOXYCYCLINE HYCLATE 100 MG PO CAPS: 100.0000 mg | ORAL_CAPSULE | Freq: Two times a day (BID) | ORAL | 0 refills | Status: AC

## 2023-01-12 NOTE — Progress Notes (Unsigned)
01/12/2023 2:13 PM   Griselda Miner 04-16-1935 [id number]  Referring provider: Benita Stabile, MD [address] [place],  [place] [address]  Followup BPH   HPI: Mr Canter is a [phone] here for followup for BPh and frequent UTI. IPSS 7 QOL 0 after urolift. He has intermittent sediment in his urine. He has intermittent burning with urination. He has urinary frequency every 3-4 hours. UA today is concerning for infection.    PMH: Past Medical History:  Diagnosis Date   CAD (coronary artery disease)    Status post CABG 1999   Essential hypertension    Hyperlipidemia    Rectal cancer [facility])     Surgical History: Past Surgical History:  Procedure Laterality Date   ABDOMINAL AORTOGRAM W/LOWER EXTREMITY N/A 11/07/2021   Procedure: ABDOMINAL AORTOGRAM W/LOWER EXTREMITY;  Surgeon: Cephus Shelling, MD;  [facility];  Service: Cardiovascular;  Laterality: N/A;   CATARACT EXTRACTION W/PHACO Left 06/25/2015   Procedure: CATARACT EXTRACTION PHACO AND INTRAOCULAR LENS PLACEMENT; CDE:  4.69;  Surgeon: Susa Simmonds, MD;  Location: [facility];  Service: Ophthalmology;  Laterality: Left;   COLOSTOMY     CORONARY ARTERY BYPASS GRAFT  1999   CYSTOSCOPY WITH INSERTION OF UROLIFT N/A 08/09/2020   Procedure: CYSTOSCOPY WITH INSERTION OF UROLIFT;  Surgeon: Malen Gauze, MD;  Location: [facility];  Service: Urology;  Laterality: N/A;   ENDARTERECTOMY FEMORAL Left 11/20/2021   Procedure: LEFT COMMON FEMORAL ENDARTERECTOMY WITH PATCH ANGIOPLASTY USING 1 CM X 6 CM XENOSURE BIOLOGIC PATCH;  Surgeon: Cephus Shelling, MD;  [facility];  Service: Vascular;  Laterality: Left;   FEMORAL-TIBIAL BYPASS GRAFT Left 11/20/2021   Procedure: HARVEST LEFT LEG GREATER SAPHENOUS VEIN, VEIN PATCH OF POSTERIOR TIBIAL ANGIOPLASTY. LEFT FEMORAL-POSTERIOR TIBIAL BYPASS GRAFT USING 6 MMX 80 CM PROPATEN GORE GRAFT WITH RING. ;  Surgeon: Cephus Shelling, MD;  [facility];  Service: Vascular;   Laterality: Left;  INSERT ARTERIAL LINE   RECTAL SURGERY      Home Medications:  Allergies as of 01/12/2023   No Known Allergies      Medication List        Accurate as of January 12, 2023  2:13 PM. If you have any questions, ask your nurse or doctor.          amLODipine 5 MG tablet Commonly known as: NORVASC Take 5 mg by mouth daily.   aspirin EC 81 MG tablet Take 81 mg by mouth daily. Swallow whole.   atorvastatin 10 MG tablet Commonly known as: LIPITOR Take 10 mg by mouth daily.   fish oil-omega-3 fatty acids 1000 MG capsule Take 1-2 g by mouth See admin instructions.  Take 2 g in the morning and 1 g at bedtime   folic acid 400 MCG tablet Commonly known as: FOLVITE Take 400 mcg by mouth daily.   losartan 50 MG tablet Commonly known as: COZAAR Take 50 mg by mouth daily.   multivitamin tablet Take 1 tablet by mouth 3 (three) times a week.   nitrofurantoin (macrocrystal-monohydrate) 100 MG capsule Commonly known as: MACROBID Take 1 capsule (100 mg total) by mouth every 12 (twelve) hours.   Opcon-A 0.027-0.315 % Soln Generic drug: Naphazoline-Pheniramine Place 1 drop into both eyes daily as needed (Burning eyes).   tamsulosin 0.4 MG Caps capsule Commonly known as: FLOMAX Take 1 capsule (0.4 mg total) by mouth daily after supper.        Allergies:  No Known Allergies  Family History: Family History  Problem Relation Age of Onset   Alcoholism Father    Diabetes Mellitus II Father     Social History:  reports that he has never smoked. He has never been exposed to tobacco smoke. His smokeless tobacco use includes chew. He reports that he does not drink alcohol and does not use drugs.  ROS: All other review of systems were reviewed and are negative except what is noted above in HPI  Physical Exam: BP (!) 101/54   Pulse 71   Constitutional:  Alert and oriented, No acute distress. HEENT: [place] AT, moist mucus membranes.  Trachea midline, no  masses. Cardiovascular: No clubbing, cyanosis, or edema. Respiratory: Normal respiratory effort, no increased work of breathing. GI: Abdomen is soft, nontender, nondistended, no abdominal masses GU: No CVA tenderness.  Lymph: No cervical or inguinal lymphadenopathy. Skin: No rashes, bruises or suspicious lesions. Neurologic: Grossly intact, no focal deficits, moving all 4 extremities. Psychiatric: Normal mood and affect.  Laboratory Data: Lab Results  Component Value Date   WBC 12.4 (H) 11/22/2021   HGB 9.5 (L) 11/22/2021   HCT 28.0 (L) 11/22/2021   MCV 89.2 11/22/2021   PLT 110 (L) 11/22/2021    Lab Results  Component Value Date   CREATININE 0.83 11/21/2021    No results found for: "PSA"  No results found for: "TESTOSTERONE"  No results found for: "HGBA1C"  Urinalysis    Component Value Date/Time   COLORURINE YELLOW 11/18/2021 1436   APPEARANCEUR Cloudy (A) 07/16/2022 1400   LABSPEC 1.013 11/18/2021 1436   PHURINE 6.0 11/18/2021 1436   GLUCOSEU Negative 07/16/2022 1400   HGBUR NEGATIVE 11/18/2021 1436   BILIRUBINUR Negative 07/16/2022 1400   KETONESUR NEGATIVE 11/18/2021 1436   PROTEINUR Negative 07/16/2022 1400   PROTEINUR NEGATIVE 11/18/2021 1436   NITRITE Positive (A) 07/16/2022 1400   NITRITE NEGATIVE 11/18/2021 1436   LEUKOCYTESUR 2+ (A) 07/16/2022 1400   LEUKOCYTESUR NEGATIVE 11/18/2021 1436    Lab Results  Component Value Date   LABMICR See below: 07/16/2022   WBCUA >30 (A) 07/16/2022   LABEPIT 0-10 07/16/2022   MUCUS Present 05/22/2020   BACTERIA Many (A) 07/16/2022    Pertinent Imaging: *** No results found for this or any previous visit.  No results found for this or any previous visit.  No results found for this or any previous visit.  No results found for this or any previous visit.  No results found for this or any previous visit.  No valid procedures specified. No results found for this or any previous visit.  No results found  for this or any previous visit.   Assessment & Plan:    1. Benign prostatic hyperplasia with urinary obstruction ***  2. Urinary retention ***  3. Acute cystitis -urine for culture -doxyccyline 100mg  BID fro 7 days.    No follow-ups on file.  Wilkie Aye, MD  [facility] [place]

## 2023-01-13 ENCOUNTER — Encounter: Payer: Self-pay | Admitting: Urology

## 2023-01-13 NOTE — Patient Instructions (Signed)

## 2023-01-16 LAB — URINE CULTURE

## 2023-02-09 DIAGNOSIS — Z23 Encounter for immunization: Secondary | ICD-10-CM | POA: Diagnosis not present

## 2023-02-25 DIAGNOSIS — H40023 Open angle with borderline findings, high risk, bilateral: Secondary | ICD-10-CM | POA: Diagnosis not present

## 2023-02-26 DIAGNOSIS — I10 Essential (primary) hypertension: Secondary | ICD-10-CM | POA: Diagnosis not present

## 2023-03-04 DIAGNOSIS — L600 Ingrowing nail: Secondary | ICD-10-CM | POA: Diagnosis not present

## 2023-03-04 DIAGNOSIS — I4891 Unspecified atrial fibrillation: Secondary | ICD-10-CM | POA: Diagnosis not present

## 2023-03-04 DIAGNOSIS — G4700 Insomnia, unspecified: Secondary | ICD-10-CM | POA: Diagnosis not present

## 2023-03-04 DIAGNOSIS — N400 Enlarged prostate without lower urinary tract symptoms: Secondary | ICD-10-CM | POA: Diagnosis not present

## 2023-03-04 DIAGNOSIS — R109 Unspecified abdominal pain: Secondary | ICD-10-CM | POA: Diagnosis not present

## 2023-03-04 DIAGNOSIS — R42 Dizziness and giddiness: Secondary | ICD-10-CM | POA: Diagnosis not present

## 2023-03-04 DIAGNOSIS — E785 Hyperlipidemia, unspecified: Secondary | ICD-10-CM | POA: Diagnosis not present

## 2023-03-04 DIAGNOSIS — I10 Essential (primary) hypertension: Secondary | ICD-10-CM | POA: Diagnosis not present

## 2023-03-04 DIAGNOSIS — Z79899 Other long term (current) drug therapy: Secondary | ICD-10-CM | POA: Diagnosis not present

## 2023-03-04 DIAGNOSIS — D649 Anemia, unspecified: Secondary | ICD-10-CM | POA: Diagnosis not present

## 2023-07-13 ENCOUNTER — Ambulatory Visit: Payer: Medicare Other | Admitting: Urology

## 2023-07-13 VITALS — BP 128/44 | HR 85

## 2023-07-13 DIAGNOSIS — N3001 Acute cystitis with hematuria: Secondary | ICD-10-CM | POA: Diagnosis not present

## 2023-07-13 DIAGNOSIS — N401 Enlarged prostate with lower urinary tract symptoms: Secondary | ICD-10-CM

## 2023-07-13 DIAGNOSIS — R82998 Other abnormal findings in urine: Secondary | ICD-10-CM

## 2023-07-13 DIAGNOSIS — N138 Other obstructive and reflux uropathy: Secondary | ICD-10-CM

## 2023-07-13 DIAGNOSIS — R3916 Straining to void: Secondary | ICD-10-CM

## 2023-07-13 DIAGNOSIS — R339 Retention of urine, unspecified: Secondary | ICD-10-CM

## 2023-07-13 LAB — URINALYSIS, ROUTINE W REFLEX MICROSCOPIC
Bilirubin, UA: NEGATIVE
Glucose, UA: NEGATIVE
Ketones, UA: NEGATIVE
Nitrite, UA: NEGATIVE
Protein,UA: NEGATIVE
Specific Gravity, UA: 1.015 (ref 1.005–1.030)
Urobilinogen, Ur: 0.2 mg/dL (ref 0.2–1.0)
pH, UA: 8.5 — ABNORMAL HIGH (ref 5.0–7.5)

## 2023-07-13 LAB — MICROSCOPIC EXAMINATION: WBC, UA: >30 /HPF — AB (ref 0–5)

## 2023-07-13 MED ORDER — NITROFURANTOIN MACROCRYSTAL 50 MG PO CAPS: 50.0000 mg | ORAL_CAPSULE | Freq: Every day | ORAL | 11 refills | Status: AC

## 2023-07-13 MED ORDER — NITROFURANTOIN MONOHYD MACRO 100 MG PO CAPS: 100.0000 mg | ORAL_CAPSULE | Freq: Two times a day (BID) | ORAL | 0 refills | Status: AC

## 2023-07-13 MED ORDER — NITROFURANTOIN MONOHYD MACRO 100 MG PO CAPS: 100.0000 mg | ORAL_CAPSULE | Freq: Two times a day (BID) | ORAL | 0 refills | Status: DC

## 2023-07-13 MED ORDER — NITROFURANTOIN MACROCRYSTAL 50 MG PO CAPS: 50.0000 mg | ORAL_CAPSULE | Freq: Every day | ORAL | 11 refills | Status: DC

## 2023-07-13 NOTE — Progress Notes (Signed)
 07/13/2023 2:13 PM   [name] [name] 13, 1936 [id number]  Referring provider: Benita Stabile, MD [address] [place],  [place] [address]  Followup BPH   HPI: Mr Lisenby is a 89yo here for follwup for BPH with urinary retention. UA concerning for infection. He has noted a weaker stream over the past 3 months. IPSS 5 QOL 1. He has intermittent straining to urinate. No dysuria or hematuria.    PMH: Past Medical History:  Diagnosis Date   CAD (coronary artery disease)    Status post CABG 1999   Essential hypertension    Hyperlipidemia    Rectal cancer [facility])     Surgical History: Past Surgical History:  Procedure Laterality Date   ABDOMINAL AORTOGRAM W/LOWER EXTREMITY N/A 11/07/2021   Procedure: ABDOMINAL AORTOGRAM W/LOWER EXTREMITY;  Surgeon: Cephus Shelling, MD;  [facility];  Service: Cardiovascular;  Laterality: N/A;   CATARACT EXTRACTION W/PHACO Left 06/25/2015   Procedure: CATARACT EXTRACTION PHACO AND INTRAOCULAR LENS PLACEMENT; CDE:  4.69;  Surgeon: Susa Simmonds, MD;  Location: [facility];  Service: Ophthalmology;  Laterality: Left;   COLOSTOMY     CORONARY ARTERY BYPASS GRAFT  1999   CYSTOSCOPY WITH INSERTION OF UROLIFT N/A 08/09/2020   Procedure: CYSTOSCOPY WITH INSERTION OF UROLIFT;  Surgeon: Malen Gauze, MD;  Location: [facility];  Service: Urology;  Laterality: N/A;   ENDARTERECTOMY FEMORAL Left 11/20/2021   Procedure: LEFT COMMON FEMORAL ENDARTERECTOMY WITH PATCH ANGIOPLASTY USING 1 CM X 6 CM XENOSURE BIOLOGIC PATCH;  Surgeon: Cephus Shelling, MD;  [facility];  Service: Vascular;  Laterality: Left;   FEMORAL-TIBIAL BYPASS GRAFT Left 11/20/2021   Procedure: HARVEST LEFT LEG GREATER SAPHENOUS VEIN, VEIN PATCH OF POSTERIOR TIBIAL ANGIOPLASTY. LEFT FEMORAL-POSTERIOR TIBIAL BYPASS GRAFT USING 6 MMX 80 CM PROPATEN GORE GRAFT WITH RING. ;  Surgeon: Cephus Shelling, MD;  [facility];  Service: Vascular;  Laterality: Left;  INSERT  ARTERIAL LINE   RECTAL SURGERY      Home Medications:  Allergies as of 07/13/2023   No Known Allergies      Medication List        Accurate as of July 13, 2023  2:13 PM. If you have any questions, ask your nurse or doctor.          amLODipine 5 MG tablet Commonly known as: NORVASC Take 5 mg by mouth daily.   aspirin EC 81 MG tablet Take 81 mg by mouth daily. Swallow whole.   atorvastatin 10 MG tablet Commonly known as: LIPITOR Take 10 mg by mouth daily.   doxycycline 100 MG capsule Commonly known as: VIBRAMYCIN Take 1 capsule (100 mg total) by mouth every 12 (twelve) hours.   fish oil-omega-3 fatty acids 1000 MG capsule Take 1-2 g by mouth See admin instructions.  Take 2 g in the morning and 1 g at bedtime   folic acid 400 MCG tablet Commonly known as: FOLVITE Take 400 mcg by mouth daily.   losartan 50 MG tablet Commonly known as: COZAAR Take 50 mg by mouth daily.   multivitamin tablet Take 1 tablet by mouth 3 (three) times a week.   nitrofurantoin (macrocrystal-monohydrate) 100 MG capsule Commonly known as: MACROBID Take 1 capsule (100 mg total) by mouth every 12 (twelve) hours.   Opcon-A 0.027-0.315 % Soln Generic drug: Naphazoline-Pheniramine Place 1 drop into both eyes daily as needed (Burning eyes).   tamsulosin 0.4 MG Caps capsule Commonly known as: FLOMAX  Take 1 capsule (0.4 mg total) by mouth daily after supper.        Allergies: No Known Allergies  Family History: Family History  Problem Relation Age of Onset   Alcoholism Father    Diabetes Mellitus II Father     Social History:  reports that he has never smoked. He has never been exposed to tobacco smoke. His smokeless tobacco use includes chew. He reports that he does not drink alcohol and does not use drugs.  ROS: All other review of systems were reviewed and are negative except what is noted above in HPI  Physical Exam: BP (!) 128/44   Pulse 85   Constitutional:  Alert and  oriented, No acute distress. HEENT: [place] AT, moist mucus membranes.  Trachea midline, no masses. Cardiovascular: No clubbing, cyanosis, or edema. Respiratory: Normal respiratory effort, no increased work of breathing. GI: Abdomen is soft, nontender, nondistended, no abdominal masses GU: No CVA tenderness.  Lymph: No cervical or inguinal lymphadenopathy. Skin: No rashes, bruises or suspicious lesions. Neurologic: Grossly intact, no focal deficits, moving all 4 extremities. Psychiatric: Normal mood and affect.  Laboratory Data: Lab Results  Component Value Date   WBC 12.4 (H) 11/22/2021   HGB 9.5 (L) 11/22/2021   HCT 28.0 (L) 11/22/2021   MCV 89.2 11/22/2021   PLT 110 (L) 11/22/2021    Lab Results  Component Value Date   CREATININE 0.83 11/21/2021    No results found for: "PSA"  No results found for: "TESTOSTERONE"  No results found for: "HGBA1C"  Urinalysis    Component Value Date/Time   COLORURINE YELLOW 11/18/2021 1436   APPEARANCEUR Cloudy (A) 01/12/2023 1426   LABSPEC 1.013 11/18/2021 1436   PHURINE 6.0 11/18/2021 1436   GLUCOSEU Negative 01/12/2023 1426   HGBUR NEGATIVE 11/18/2021 1436   BILIRUBINUR Negative 01/12/2023 1426   KETONESUR NEGATIVE 11/18/2021 1436   PROTEINUR Trace 01/12/2023 1426   PROTEINUR NEGATIVE 11/18/2021 1436   NITRITE Negative 01/12/2023 1426   NITRITE NEGATIVE 11/18/2021 1436   LEUKOCYTESUR 3+ (A) 01/12/2023 1426   LEUKOCYTESUR NEGATIVE 11/18/2021 1436    Lab Results  Component Value Date   LABMICR See below: 01/12/2023   WBCUA >30 (A) 01/12/2023   LABEPIT 0-10 01/12/2023   MUCUS Present 05/22/2020   BACTERIA Many (A) 01/12/2023    Pertinent Imaging:  No results found for this or any previous visit.  No results found for this or any previous visit.  No results found for this or any previous visit.  No results found for this or any previous visit.  No results found for this or any previous visit.  No results found for  this or any previous visit.  No results found for this or any previous visit.  No results found for this or any previous visit.   Assessment & Plan:    1. Benign prostatic hyperplasia with urinary obstruction (Primary) Continue observation - Urinalysis, Routine w reflex microscopic  2. Urinary retention Continue observation  3. Acute cystitis -macrobid 100mg  BID for 7 days -start macrodantin 50mg  qhs   No follow-ups on file.  Wilkie Aye, MD  [facility] 

## 2023-07-16 LAB — URINE CULTURE

## 2023-07-18 ENCOUNTER — Encounter: Payer: Self-pay | Admitting: Urology

## 2023-07-18 NOTE — Patient Instructions (Signed)
 Urinary Tract Infection, Male A urinary tract infection (UTI) is an infection in your urinary tract. The urinary tract is made up of the organs that make, store, and get rid of pee (urine) in your body. These organs include: The kidneys. The ureters. The bladder. The urethra. What are the causes? Most UTIs are caused by germs called bacteria. They may be in or near your genitals. These germs grow and cause swelling in your urinary tract. What increases the risk? You're more likely to get a UTI if: You have a soft tube called a catheter that drains your pee. You can't control when you pee or poop. You have trouble peeing because of: A prostate that's bigger than normal. A kidney stone. A urinary blockage. A nerve condition that affects your bladder. Not getting enough to drink. You're sexually active. You're an older adult. You're also more likely to get a UTI if you have other health problems, such as: Diabetes. A weak immune system. Your immune system is your body's defense system. Sickle cell disease. Injury of the spine. What are the signs or symptoms? Symptoms may include: Needing to pee right away. Peeing small amounts often. Trouble getting the stream started. Pain or burning when you pee. Blood in your pee. Pee that smells bad or odd. Pain in your belly or lower back. You may also: Feel confused. This may be the first symptom in older adults. Vomit. Not feel hungry. Feel tired or easily annoyed. Have a fever or chills. How is this diagnosed? A UTI is diagnosed based on your medical history and an exam. You may also have other tests. These may include: Pee tests. Blood tests. Tests for sexually transmitted infections (STIs). If you've had more than one UTI, you may need to have imaging studies done to find out why you keep getting them. How is this treated? A UTI can be treated by: Taking antibiotics or other medicines. Drinking enough fluid to keep your pee  pale yellow. In rare cases, a UTI can cause a very bad condition called sepsis. Sepsis may be treated in the hospital. Follow these instructions at home: Medicines Take your medicines only as told by your health care provider. If you were given antibiotics, take them as told by your provider. Do not stop taking them even if you start to feel better. General instructions Make sure you: Pee often and fully. Do not hold your pee for a long time. Pee after you have sex. Contact a health care provider if: Your symptoms don't get better after 1-2 days of taking antibiotics. Your symptoms go away and then come back. You have a fever or chills. You vomit or feel like you may vomit. Get help right away if: You have very bad pain in your back or lower belly. You faint. This information is not intended to replace advice given to you by your health care provider. Make sure you discuss any questions you have with your health care provider. Document Revised: 07/11/2022 Document Reviewed: 07/11/2022 Elsevier Patient Education  2024 [employer].

## 2023-08-24 DIAGNOSIS — I10 Essential (primary) hypertension: Secondary | ICD-10-CM | POA: Diagnosis not present

## 2023-09-02 DIAGNOSIS — I10 Essential (primary) hypertension: Secondary | ICD-10-CM | POA: Diagnosis not present

## 2023-09-02 DIAGNOSIS — D649 Anemia, unspecified: Secondary | ICD-10-CM | POA: Diagnosis not present

## 2023-09-02 DIAGNOSIS — I4891 Unspecified atrial fibrillation: Secondary | ICD-10-CM | POA: Diagnosis not present

## 2023-09-02 DIAGNOSIS — N400 Enlarged prostate without lower urinary tract symptoms: Secondary | ICD-10-CM | POA: Diagnosis not present

## 2023-09-02 DIAGNOSIS — G4700 Insomnia, unspecified: Secondary | ICD-10-CM | POA: Diagnosis not present

## 2023-09-02 DIAGNOSIS — R42 Dizziness and giddiness: Secondary | ICD-10-CM | POA: Diagnosis not present

## 2023-09-02 DIAGNOSIS — E785 Hyperlipidemia, unspecified: Secondary | ICD-10-CM | POA: Diagnosis not present

## 2023-09-02 DIAGNOSIS — Z79899 Other long term (current) drug therapy: Secondary | ICD-10-CM | POA: Diagnosis not present

## 2023-09-02 DIAGNOSIS — R109 Unspecified abdominal pain: Secondary | ICD-10-CM | POA: Diagnosis not present

## 2023-09-02 DIAGNOSIS — L600 Ingrowing nail: Secondary | ICD-10-CM | POA: Diagnosis not present

## 2023-11-27 ENCOUNTER — Other Ambulatory Visit: Payer: Self-pay

## 2023-11-27 DIAGNOSIS — I739 Peripheral vascular disease, unspecified: Secondary | ICD-10-CM

## 2024-01-12 ENCOUNTER — Ambulatory Visit (INDEPENDENT_AMBULATORY_CARE_PROVIDER_SITE_OTHER)

## 2024-01-12 ENCOUNTER — Ambulatory Visit

## 2024-01-12 DIAGNOSIS — I739 Peripheral vascular disease, unspecified: Secondary | ICD-10-CM | POA: Diagnosis not present

## 2024-01-12 LAB — VAS US ABI WITH/WO TBI
Left ABI: 0.94
Right ABI: 0.57

## 2024-01-19 ENCOUNTER — Ambulatory Visit: Admitting: Physician Assistant

## 2024-01-19 VITALS — BP 159/76 | HR 84 | Ht 68.0 in | Wt 118.0 lb

## 2024-01-19 DIAGNOSIS — I739 Peripheral vascular disease, unspecified: Secondary | ICD-10-CM

## 2024-01-19 DIAGNOSIS — Z23 Encounter for immunization: Secondary | ICD-10-CM | POA: Diagnosis not present

## 2024-01-19 NOTE — Progress Notes (Unsigned)
 Office Note     CC:  follow up Requesting Provider:  Shona Norleen PEDLAR, MD  HPI: [name] is a 89 y.o. (02-19-1935) male who presents for surveillance of PAD.  He has history of left common femoral endarterectomy with bovine patch angioplasty, harvest of saphenous vein for vein patch of the left PTA and common femoral to posterior tibial artery bypass with PTFE by Dr. Gretta on 11/20/2021 due to critical limb ischemia with toe wound.  He denies any new tissue loss as well as rest pain.  He is ambulatory with a cane.  He has a known right SFA occlusion based on prior angiography.  He takes a daily aspirin  and statin.  He denies tobacco use.  He is accompanied today by his son-in-law.   Past Medical History:  Diagnosis Date   CAD (coronary artery disease)    Status post CABG 1999   Essential hypertension    Hyperlipidemia    Rectal cancer [facility])     Past Surgical History:  Procedure Laterality Date   ABDOMINAL AORTOGRAM W/LOWER EXTREMITY N/A 11/07/2021   Procedure: ABDOMINAL AORTOGRAM W/LOWER EXTREMITY;  Surgeon: Gretta Lonni PARAS, MD;  [facility];  Service: Cardiovascular;  Laterality: N/A;   CATARACT EXTRACTION W/PHACO Left 06/25/2015   Procedure: CATARACT EXTRACTION PHACO AND INTRAOCULAR LENS PLACEMENT; CDE:  4.69;  Surgeon: Dow JULIANNA Burke, MD;  Location: [facility];  Service: Ophthalmology;  Laterality: Left;   COLOSTOMY     CORONARY ARTERY BYPASS GRAFT  1999   CYSTOSCOPY WITH INSERTION OF UROLIFT N/A 08/09/2020   Procedure: CYSTOSCOPY WITH INSERTION OF UROLIFT;  Surgeon: Sherrilee Belvie CROME, MD;  Location: [facility];  Service: Urology;  Laterality: N/A;   ENDARTERECTOMY FEMORAL Left 11/20/2021   Procedure: LEFT COMMON FEMORAL ENDARTERECTOMY WITH PATCH ANGIOPLASTY USING 1 CM X 6 CM XENOSURE BIOLOGIC PATCH;  Surgeon: Gretta Lonni PARAS, MD;  Location: [facility] OR;  Service: Vascular;  Laterality: Left;   FEMORAL-TIBIAL BYPASS GRAFT Left 11/20/2021   Procedure: HARVEST LEFT LEG  GREATER SAPHENOUS VEIN, VEIN PATCH OF POSTERIOR TIBIAL ANGIOPLASTY. LEFT FEMORAL-POSTERIOR TIBIAL BYPASS GRAFT USING 6 MMX 80 CM PROPATEN GORE GRAFT WITH RING. ;  Surgeon: Gretta Lonni PARAS, MD;  Location: [facility] OR;  Service: Vascular;  Laterality: Left;  INSERT ARTERIAL LINE   RECTAL SURGERY      Social History   Socioeconomic History   Marital status: Married    Spouse name: Cy   Number of children: Not on file   Years of education: Not on file   Highest education level: Not on file  Occupational History   Occupation: Retired  Tobacco Use   Smoking status: Never    Passive exposure: Never   Smokeless tobacco: Current    Types: Chew  Vaping Use   Vaping status: Never Used  Substance and Sexual Activity   Alcohol use: No   Drug use: No   Sexual activity: Not Currently  Other Topics Concern   Not on file  Social History Narrative   Exercises regularly   Social Drivers of Health   Financial Resource Strain: Not on file  Food Insecurity: Not on file  Transportation Needs: Not on file  Physical Activity: Not on file  Stress: Not on file  Social Connections: Not on file  Intimate Partner Violence: Not on file    Family History  Problem Relation Age of Onset   Alcoholism Father    Diabetes Mellitus II Father     Current Outpatient  Medications  Medication Sig Dispense Refill   amLODipine  (NORVASC ) 5 MG tablet Take 5 mg by mouth daily.     aspirin  EC 81 MG tablet Take 81 mg by mouth daily. Swallow whole.     atorvastatin  (LIPITOR) 10 MG tablet Take 10 mg by mouth daily.     doxycycline  (VIBRAMYCIN ) 100 MG capsule Take 1 capsule (100 mg total) by mouth every 12 (twelve) hours. 14 capsule 0   fish oil-omega-3 fatty acids 1000 MG capsule Take 1-2 g by mouth See admin instructions.  Take 2 g in the morning and 1 g at bedtime     folic acid (FOLVITE) 400 MCG tablet Take 400 mcg by mouth daily.     losartan  (COZAAR ) 50 MG tablet Take 50 mg by mouth daily.     Multiple  Vitamin (MULTIVITAMIN) tablet Take 1 tablet by mouth 3 (three) times a week.     Naphazoline-Pheniramine (OPCON-A) 0.027-0.315 % SOLN Place 1 drop into both eyes daily as needed (Burning eyes).     nitrofurantoin  (MACRODANTIN ) 50 MG capsule Take 1 capsule (50 mg total) by mouth at bedtime. 30 capsule 11   nitrofurantoin , macrocrystal-monohydrate, (MACROBID ) 100 MG capsule Take 1 capsule (100 mg total) by mouth every 12 (twelve) hours. 14 capsule 0   tamsulosin  (FLOMAX ) 0.4 MG CAPS capsule Take 1 capsule (0.4 mg total) by mouth daily after supper. 30 capsule 0   No current facility-administered medications for this visit.    No Known Allergies   REVIEW OF SYSTEMS:   [X]  denotes positive finding, [ ]  denotes negative finding Cardiac  Comments:  Chest pain or chest pressure:    Shortness of breath upon exertion:    Short of breath when lying flat:    Irregular heart rhythm:        Vascular    Pain in calf, thigh, or hip brought on by ambulation:    Pain in feet at night that wakes you up from your sleep:     Blood clot in your veins:    Leg swelling:         Pulmonary    Oxygen at home:    Productive cough:     Wheezing:         Neurologic    Sudden weakness in arms or legs:     Sudden numbness in arms or legs:     Sudden onset of difficulty speaking or slurred speech:    Temporary loss of vision in one eye:     Problems with dizziness:         Gastrointestinal    Blood in stool:     Vomited blood:         Genitourinary    Burning when urinating:     Blood in urine:        Psychiatric    Major depression:         Hematologic    Bleeding problems:    Problems with blood clotting too easily:        Skin    Rashes or ulcers:        Constitutional    Fever or chills:      PHYSICAL EXAMINATION:  Vitals:   01/19/24 1449  BP: (!) 159/76  Pulse: 84  Weight: 118 lb (53.5 kg)  Height: 5' 8 (1.727 m)    General:  WDWN in NAD; vital signs documented  above Gait: Not observed HENT: WNL, normocephalic Pulmonary: normal non-labored breathing Cardiac: regular  HR Abdomen: soft, NT, no masses Skin: without rashes Vascular Exam/Pulses: Palpable left PT pulse Extremities: without ischemic changes, without Gangrene , without cellulitis; without open wounds;  Musculoskeletal: no muscle wasting or atrophy  Neurologic: A&O X 3 Psychiatric:  The pt has Normal affect.   Non-Invasive Vascular Imaging:   Left leg bypass widely patent by duplex  ABI/TBIToday's ABIToday's TBIPrevious ABIPrevious TBI  +-------+-----------+-----------+------------+------------+  Right 0.57       0.47       0.57        0.33          +-------+-----------+-----------+------------+------------+  Left  0.94       0.57       1.03        0.62          +-------+-----------+-----------+------------+------------+      ASSESSMENT/PLAN:: 89 y.o. male here for surveillance of left leg bypass  Left lower extremity well-perfused with palpable PT pulse.  Duplex demonstrates a widely patent bypass.  He has a known right SFA occlusion however this is asymptomatic.  He will continue his aspirin  and statin daily.  We will repeat imaging in 1 year.  He knows to call/turn office sooner with any questions or concerns.   Donnice Sender, PA-C [facility] [phone]

## 2024-01-20 ENCOUNTER — Ambulatory Visit: Admitting: Urology

## 2024-01-20 ENCOUNTER — Encounter: Payer: Self-pay | Admitting: Urology

## 2024-01-20 VITALS — BP 115/63 | HR 80

## 2024-01-20 DIAGNOSIS — Z87440 Personal history of urinary (tract) infections: Secondary | ICD-10-CM | POA: Diagnosis not present

## 2024-01-20 DIAGNOSIS — N401 Enlarged prostate with lower urinary tract symptoms: Secondary | ICD-10-CM | POA: Diagnosis not present

## 2024-01-20 DIAGNOSIS — N138 Other obstructive and reflux uropathy: Secondary | ICD-10-CM

## 2024-01-20 DIAGNOSIS — N3001 Acute cystitis with hematuria: Secondary | ICD-10-CM

## 2024-01-20 DIAGNOSIS — R339 Retention of urine, unspecified: Secondary | ICD-10-CM

## 2024-01-20 LAB — URINALYSIS, ROUTINE W REFLEX MICROSCOPIC
Bilirubin, UA: NEGATIVE
Glucose, UA: NEGATIVE
Ketones, UA: NEGATIVE
Nitrite, UA: NEGATIVE
Protein,UA: NEGATIVE
Specific Gravity, UA: 1.01 (ref 1.005–1.030)
Urobilinogen, Ur: 1 mg/dL (ref 0.2–1.0)
pH, UA: 6 (ref 5.0–7.5)

## 2024-01-20 LAB — MICROSCOPIC EXAMINATION: Bacteria, UA: NONE SEEN

## 2024-01-20 NOTE — Progress Notes (Signed)
 01/20/2024 2:15 PM   SPYROS WINCH October 31, 1934 [id number]  Referring provider: Shona Norleen PEDLAR, MD [address] [place],  [place] [address]  Followup BPH   HPI: Mr Vaeth is a 89yo here for followup for BPh with urinary retention. IPSS 2 QOL 0 after urolift. He had a UTI  last visit treated with macrobid . No complaints.   PMH: Past Medical History:  Diagnosis Date   CAD (coronary artery disease)    Status post CABG 1999   Essential hypertension    Hyperlipidemia    Rectal cancer [facility])     Surgical History: Past Surgical History:  Procedure Laterality Date   ABDOMINAL AORTOGRAM W/LOWER EXTREMITY N/A 11/07/2021   Procedure: ABDOMINAL AORTOGRAM W/LOWER EXTREMITY;  Surgeon: Gretta Lonni PARAS, MD;  [facility];  Service: Cardiovascular;  Laterality: N/A;   CATARACT EXTRACTION W/PHACO Left 06/25/2015   Procedure: CATARACT EXTRACTION PHACO AND INTRAOCULAR LENS PLACEMENT; CDE:  4.69;  Surgeon: Dow JULIANNA Burke, MD;  Location: [facility];  Service: Ophthalmology;  Laterality: Left;   COLOSTOMY     CORONARY ARTERY BYPASS GRAFT  1999   CYSTOSCOPY WITH INSERTION OF UROLIFT N/A 08/09/2020   Procedure: CYSTOSCOPY WITH INSERTION OF UROLIFT;  Surgeon: Sherrilee Belvie CROME, MD;  Location: [facility];  Service: Urology;  Laterality: N/A;   ENDARTERECTOMY FEMORAL Left 11/20/2021   Procedure: LEFT COMMON FEMORAL ENDARTERECTOMY WITH PATCH ANGIOPLASTY USING 1 CM X 6 CM XENOSURE BIOLOGIC PATCH;  Surgeon: Gretta Lonni PARAS, MD;  [facility];  Service: Vascular;  Laterality: Left;   FEMORAL-TIBIAL BYPASS GRAFT Left 11/20/2021   Procedure: HARVEST LEFT LEG GREATER SAPHENOUS VEIN, VEIN PATCH OF POSTERIOR TIBIAL ANGIOPLASTY. LEFT FEMORAL-POSTERIOR TIBIAL BYPASS GRAFT USING 6 MMX 80 CM PROPATEN GORE GRAFT WITH RING. ;  Surgeon: Gretta Lonni PARAS, MD;  Location: [facility];  Service: Vascular;  Laterality: Left;  INSERT ARTERIAL LINE   RECTAL SURGERY      Home Medications:  Allergies as  of 01/20/2024   No Known Allergies      Medication List        Accurate as of January 20, 2024  2:15 PM. If you have any questions, ask your nurse or doctor.          amLODipine  5 MG tablet Commonly known as: NORVASC  Take 5 mg by mouth daily.   aspirin  EC 81 MG tablet Take 81 mg by mouth daily. Swallow whole.   atorvastatin  10 MG tablet Commonly known as: LIPITOR Take 10 mg by mouth daily.   doxycycline  100 MG capsule Commonly known as: VIBRAMYCIN  Take 1 capsule (100 mg total) by mouth every 12 (twelve) hours.   fish oil-omega-3 fatty acids 1000 MG capsule Take 1-2 g by mouth See admin instructions.  Take 2 g in the morning and 1 g at bedtime   folic acid 400 MCG tablet Commonly known as: FOLVITE Take 400 mcg by mouth daily.   losartan  50 MG tablet Commonly known as: COZAAR  Take 50 mg by mouth daily.   multivitamin tablet Take 1 tablet by mouth 3 (three) times a week.   nitrofurantoin  (macrocrystal-monohydrate) 100 MG capsule Commonly known as: MACROBID  Take 1 capsule (100 mg total) by mouth every 12 (twelve) hours.   nitrofurantoin  50 MG capsule Commonly known as: Macrodantin  Take 1 capsule (50 mg total) by mouth at bedtime.   Opcon-A 0.027-0.315 % Soln Generic drug: Naphazoline-Pheniramine Place 1 drop into both eyes daily as needed (Burning eyes).   tamsulosin   0.4 MG Caps capsule Commonly known as: FLOMAX  Take 1 capsule (0.4 mg total) by mouth daily after supper.        Allergies: No Known Allergies  Family History: Family History  Problem Relation Age of Onset   Alcoholism Father    Diabetes Mellitus II Father     Social History:  reports that he has never smoked. He has never been exposed to tobacco smoke. His smokeless tobacco use includes chew. He reports that he does not drink alcohol and does not use drugs.  ROS: All other review of systems were reviewed and are negative except what is noted above in HPI  Physical Exam: BP 115/63    Pulse 80   Constitutional:  Alert and oriented, No acute distress. HEENT: [place] AT, moist mucus membranes.  Trachea midline, no masses. Cardiovascular: No clubbing, cyanosis, or edema. Respiratory: Normal respiratory effort, no increased work of breathing. GI: Abdomen is soft, nontender, nondistended, no abdominal masses GU: No CVA tenderness.  Lymph: No cervical or inguinal lymphadenopathy. Skin: No rashes, bruises or suspicious lesions. Neurologic: Grossly intact, no focal deficits, moving all 4 extremities. Psychiatric: Normal mood and affect.  Laboratory Data: Lab Results  Component Value Date   WBC 12.4 (H) 11/22/2021   HGB 9.5 (L) 11/22/2021   HCT 28.0 (L) 11/22/2021   MCV 89.2 11/22/2021   PLT 110 (L) 11/22/2021    Lab Results  Component Value Date   CREATININE 0.83 11/21/2021    No results found for: PSA  No results found for: TESTOSTERONE  No results found for: HGBA1C  Urinalysis    Component Value Date/Time   COLORURINE YELLOW 11/18/2021 1436   APPEARANCEUR Clear 07/13/2023 1404   LABSPEC 1.013 11/18/2021 1436   PHURINE 6.0 11/18/2021 1436   GLUCOSEU Negative 07/13/2023 1404   HGBUR NEGATIVE 11/18/2021 1436   BILIRUBINUR Negative 07/13/2023 1404   KETONESUR NEGATIVE 11/18/2021 1436   PROTEINUR Negative 07/13/2023 1404   PROTEINUR NEGATIVE 11/18/2021 1436   NITRITE Negative 07/13/2023 1404   NITRITE NEGATIVE 11/18/2021 1436   LEUKOCYTESUR 3+ (A) 07/13/2023 1404   LEUKOCYTESUR NEGATIVE 11/18/2021 1436    Lab Results  Component Value Date   LABMICR See below: 07/13/2023   WBCUA >30 (A) 07/13/2023   LABEPIT 0-10 07/13/2023   MUCUS Present 05/22/2020   BACTERIA Many (A) 07/13/2023    Pertinent Imaging:  No results found for this or any previous visit.  No results found for this or any previous visit.  No results found for this or any previous visit.  No results found for this or any previous visit.  No results found for this or any  previous visit.  No results found for this or any previous visit.  No results found for this or any previous visit.  No results found for this or any previous visit.   Assessment & Plan:    1. Benign prostatic hyperplasia with urinary obstruction (Primary) Improved after Urolift - Urinalysis, Routine w reflex microscopic  2. Urinary retention Resolved after UROLIFT   No follow-ups on file.  Belvie Clara, MD  [facility] [place]

## 2024-01-20 NOTE — Patient Instructions (Signed)

## 2024-03-01 DIAGNOSIS — E785 Hyperlipidemia, unspecified: Secondary | ICD-10-CM | POA: Diagnosis not present

## 2024-03-01 DIAGNOSIS — I10 Essential (primary) hypertension: Secondary | ICD-10-CM | POA: Diagnosis not present

## 2024-03-07 DIAGNOSIS — E785 Hyperlipidemia, unspecified: Secondary | ICD-10-CM | POA: Diagnosis not present

## 2024-03-07 DIAGNOSIS — R251 Tremor, unspecified: Secondary | ICD-10-CM | POA: Diagnosis not present

## 2024-03-07 DIAGNOSIS — Z0001 Encounter for general adult medical examination with abnormal findings: Secondary | ICD-10-CM | POA: Diagnosis not present

## 2024-03-07 DIAGNOSIS — N400 Enlarged prostate without lower urinary tract symptoms: Secondary | ICD-10-CM | POA: Diagnosis not present

## 2024-03-07 DIAGNOSIS — Z79899 Other long term (current) drug therapy: Secondary | ICD-10-CM | POA: Diagnosis not present

## 2024-03-07 DIAGNOSIS — D696 Thrombocytopenia, unspecified: Secondary | ICD-10-CM | POA: Diagnosis not present

## 2024-03-07 DIAGNOSIS — I4891 Unspecified atrial fibrillation: Secondary | ICD-10-CM | POA: Diagnosis not present

## 2024-03-07 DIAGNOSIS — D649 Anemia, unspecified: Secondary | ICD-10-CM | POA: Diagnosis not present

## 2024-03-07 DIAGNOSIS — Z0000 Encounter for general adult medical examination without abnormal findings: Secondary | ICD-10-CM | POA: Diagnosis not present

## 2024-03-07 DIAGNOSIS — J069 Acute upper respiratory infection, unspecified: Secondary | ICD-10-CM | POA: Diagnosis not present

## 2024-03-07 DIAGNOSIS — R42 Dizziness and giddiness: Secondary | ICD-10-CM | POA: Diagnosis not present

## 2024-03-07 DIAGNOSIS — I10 Essential (primary) hypertension: Secondary | ICD-10-CM | POA: Diagnosis not present

## 2025-01-25 ENCOUNTER — Ambulatory Visit: Admitting: Urology
